# Patient Record
Sex: Female | Born: 1990
Health system: Southern US, Community
[De-identification: ages and names within clinical notes are randomized; demographics above are authoritative.]

## PROBLEM LIST (undated history)

## (undated) DIAGNOSIS — I10 Essential (primary) hypertension: Secondary | ICD-10-CM

## (undated) DIAGNOSIS — F32A Depression, unspecified: Secondary | ICD-10-CM

## (undated) DIAGNOSIS — J45909 Unspecified asthma, uncomplicated: Secondary | ICD-10-CM

## (undated) HISTORY — DX: Essential (primary) hypertension: I10

## (undated) HISTORY — DX: Unspecified asthma, uncomplicated: J45.909

## (undated) HISTORY — DX: Depression, unspecified: F32.A

## (undated) HISTORY — PX: EYE SURGERY: SHX253

---

## 2013-08-27 DIAGNOSIS — O1490 Unspecified pre-eclampsia, unspecified trimester: Secondary | ICD-10-CM

## 2020-05-30 LAB — OB RESULTS CONSOLE HGB/HCT, BLOOD
HCT: 39 (ref 29–41)
Hemoglobin: 12.9

## 2020-05-30 LAB — OB RESULTS CONSOLE ABO/RH: RH Type: NEGATIVE

## 2020-05-30 LAB — OB RESULTS CONSOLE HIV ANTIBODY (ROUTINE TESTING): HIV: NONREACTIVE

## 2020-05-30 LAB — OB RESULTS CONSOLE GC/CHLAMYDIA
Chlamydia: NEGATIVE
Gonorrhea: NEGATIVE

## 2020-05-30 LAB — OB RESULTS CONSOLE HEPATITIS B SURFACE ANTIGEN: Hepatitis B Surface Ag: NEGATIVE

## 2020-05-30 LAB — OB RESULTS CONSOLE RUBELLA ANTIBODY, IGM: Rubella: IMMUNE

## 2020-05-30 LAB — HEPATITIS C ANTIBODY: HCV Ab: NEGATIVE

## 2020-05-30 LAB — OB RESULTS CONSOLE RPR: RPR: NONREACTIVE

## 2020-05-30 LAB — OB RESULTS CONSOLE PLATELET COUNT: Platelets: 253

## 2020-05-30 LAB — OB RESULTS CONSOLE ANTIBODY SCREEN: Antibody Screen: NEGATIVE

## 2020-09-26 ENCOUNTER — Ambulatory Visit: Payer: Self-pay | Admitting: Family Medicine

## 2020-10-01 ENCOUNTER — Ambulatory Visit (INDEPENDENT_AMBULATORY_CARE_PROVIDER_SITE_OTHER): Payer: Medicaid Other | Admitting: Internal Medicine

## 2020-10-01 ENCOUNTER — Encounter: Payer: Self-pay | Admitting: Internal Medicine

## 2020-10-01 ENCOUNTER — Ambulatory Visit: Payer: Self-pay | Admitting: Internal Medicine

## 2020-10-01 ENCOUNTER — Other Ambulatory Visit: Payer: Self-pay

## 2020-10-01 DIAGNOSIS — F339 Major depressive disorder, recurrent, unspecified: Secondary | ICD-10-CM | POA: Diagnosis not present

## 2020-10-01 DIAGNOSIS — J4520 Mild intermittent asthma, uncomplicated: Secondary | ICD-10-CM

## 2020-10-01 MED ORDER — ESCITALOPRAM OXALATE 10 MG PO TABS: 10.0000 mg | ORAL_TABLET | Freq: Every day | ORAL | 1 refills | Status: DC

## 2020-10-01 NOTE — Progress Notes (Signed)
HPI  Pt presents to the clinic today to establish care and for management of the conditions listed below. She has not had a PCP in many years but has been following with OB/GYN- currently pregnant.  Childhood Asthma: She reports she outgrew this. She does get some chest tightness when she gets a cold. She does not have an Albuterol inhaler on hand.   Depression: Managed on Escitalopram and Buspirone. She has had some passive SI in the past but none currently. She is not seeing a therapist.  Flu: never Tetanus: 2017 Covid: never Pap Smear: 04/2020 Dentist: annually  No past medical history on file.  Current Outpatient Medications  Medication Sig Dispense Refill  . aspirin 81 MG chewable tablet Chew by mouth.    . busPIRone (BUSPAR) 15 MG tablet Take by mouth.    . Doxylamine-Pyridoxine 10-10 MG TBEC Take 1 tablet at bedtime daily.  If needed may increase to 2 tablets at bedtime, 1 tablet in the morning and 1 tablet in the afternoon.  Maximum of 4 tablets daily.    Marland Kitchen escitalopram (LEXAPRO) 10 MG tablet Take by mouth.     No current facility-administered medications for this visit.    No Known Allergies  No family history on file.  Social History   Socioeconomic History  . Marital status: Legally Separated    Spouse name: Not on file  . Number of children: Not on file  . Years of education: Not on file  . Highest education level: Not on file  Occupational History  . Not on file  Tobacco Use  . Smoking status: Not on file  . Smokeless tobacco: Not on file  Substance and Sexual Activity  . Alcohol use: Not on file  . Drug use: Not on file  . Sexual activity: Not on file  Other Topics Concern  . Not on file  Social History Narrative  . Not on file   Social Determinants of Health   Financial Resource Strain: Not on file  Food Insecurity: Not on file  Transportation Needs: Not on file  Physical Activity: Not on file  Stress: Not on file  Social Connections: Not on  file  Intimate Partner Violence: Not on file    ROS:  Constitutional: Denies fever, malaise, fatigue, headache or abrupt weight changes.  HEENT: Denies eye pain, eye redness, ear pain, ringing in the ears, wax buildup, runny nose, nasal congestion, bloody nose, or sore throat. Respiratory: Denies difficulty breathing, shortness of breath, cough or sputum production.   Cardiovascular: Denies chest pain, chest tightness, palpitations or swelling in the hands or feet.  Gastrointestinal: Denies abdominal pain, bloating, constipation, diarrhea or blood in the stool.  GU: Denies frequency, urgency, pain with urination, blood in urine, odor or discharge. Musculoskeletal: Denies decrease in range of motion, difficulty with gait, muscle pain or joint pain and swelling.  Skin: Denies redness, rashes, lesions or ulcercations.  Neurological: Denies dizziness, difficulty with memory, difficulty with speech or problems with balance and coordination.  Psych: Pt has a history of anxiety and depression. Denies SI/HI.  No other specific complaints in a complete review of systems (except as listed in HPI above).  PE:  BP 124/82 (BP Location: Right Arm, Patient Position: Sitting, Cuff Size: Large)   Pulse (!) 108   Temp (!) 97 F (36.1 C) (Temporal)   Ht 5\' 8"  (1.727 m)   Wt 198 lb 12.8 oz (90.2 kg)   SpO2 97%   BMI 30.23 kg/m  Wt  Readings from Last 3 Encounters:  10/01/20 198 lb 12.8 oz (90.2 kg)    General: Appears her stated age, pregnant, in NAD. HEENT: Head: normal shape and size; Neck: Neck supple, trachea midline. No masses, lumps or thyromegaly present.  Cardiovascular: Tachycardic with normal rhythm. S1,S2 noted.  No murmur, rubs or gallops noted. No JVD or BLE edema. Pulmonary/Chest: Normal effort and positive vesicular breath sounds. No respiratory distress. No wheezes, rales or ronchi noted.  Musculoskeletal: No difficulty with gait.  Neurological: Alert and oriented.  Psychiatric:  Mood and affect normal. Behavior is normal. Judgment and thought content normal.     Assessment and Plan:

## 2020-10-02 ENCOUNTER — Encounter: Payer: Self-pay | Admitting: Internal Medicine

## 2020-10-02 ENCOUNTER — Other Ambulatory Visit: Payer: Self-pay | Admitting: Internal Medicine

## 2020-10-02 DIAGNOSIS — F339 Major depressive disorder, recurrent, unspecified: Secondary | ICD-10-CM

## 2020-10-02 DIAGNOSIS — J45909 Unspecified asthma, uncomplicated: Secondary | ICD-10-CM | POA: Insufficient documentation

## 2020-10-02 HISTORY — DX: Major depressive disorder, recurrent, unspecified: F33.9

## 2020-10-02 HISTORY — DX: Unspecified asthma, uncomplicated: J45.909

## 2020-10-02 MED ORDER — ALBUTEROL SULFATE HFA 108 (90 BASE) MCG/ACT IN AERS: 2.0000 | INHALATION_SPRAY | Freq: Four times a day (QID) | RESPIRATORY_TRACT | 0 refills | Status: DC | PRN

## 2020-10-02 NOTE — Patient Instructions (Signed)
Persistent Depressive Disorder  Persistent depressive disorder (PDD) is a mental health condition. PDD causes symptoms of low-level depression for 2 years or longer. It may also be called long-term (chronic) depression or dysthymia. PDD may include episodes of more severe depression that last for about 2 weeks (major depressive disorder or MDD). PDD can affect the way you think, feel, and sleep. This condition may also affect your relationships. You may be more likely to get sick if you have PDD. Symptoms of PDD occur for most of the day and may include:  Feeling tired (fatigue).  Low energy.  Eating too much or too little.  Sleeping too much or too little.  Feeling restless or agitated.  Feeling hopeless.  Feeling worthless or guilty.  Feeling worried or nervous (anxiety).  Trouble concentrating or making decisions.  Low self-esteem.  A negative way of looking at things (outlook).  Not being able to have fun or feel pleasure.  Avoiding interacting with people.  Getting angry or annoyed easily (irritability).  Acting aggressive or angry. Follow these instructions at home: Activity  Go back to your normal activities as told by your doctor.  Exercise regularly as told by your doctor. General instructions  Take over-the-counter and prescription medicines only as told by your doctor.  Do not drink alcohol. Or, limit how much alcohol you drink to no more than 1 drink a day for nonpregnant women and 2 drinks a day for men. One drink equals 12 oz of beer, 5 oz of wine, or 1 oz of hard liquor. Alcohol can affect any antidepressant medicines you are taking. Talk with your doctor about your alcohol use.  Eat a healthy diet and get plenty of sleep.  Find activities that you enjoy each day.  Consider joining a support group. Your doctor may be able to suggest a support group.  Keep all follow-up visits as told by your doctor. This is important. Where to find more  information National Alliance on Mental Illness  www.nami.org [place] National Institute of Mental Health  www.nimh.nih.gov National Suicide Prevention Lifeline  (1-800-273-8255).  This is free, 24-hour help. Contact a doctor if:  Your symptoms get worse.  You have new symptoms.  You have trouble sleeping or doing your daily activities. Get help right away if:  You self-harm.  You have serious thoughts about hurting yourself or others.  You see, hear, taste, smell, or feel things that are not there (hallucinate). This information is not intended to replace advice given to you by your health care provider. Make sure you discuss any questions you have with your health care provider. Document Revised: 09/04/2017 Document Reviewed: 05/16/2016 Elsevier Patient Education  2020 Elsevier Inc.  

## 2020-10-02 NOTE — Assessment & Plan Note (Signed)
RX for Albuterol inhaler sent to pharmacy

## 2020-10-02 NOTE — Assessment & Plan Note (Signed)
Stable on Escitalopram (refilled today) and Buspirone Support offered

## 2020-10-03 ENCOUNTER — Encounter: Payer: Self-pay | Admitting: Family Medicine

## 2020-10-03 ENCOUNTER — Other Ambulatory Visit: Payer: Self-pay

## 2020-10-03 ENCOUNTER — Ambulatory Visit (INDEPENDENT_AMBULATORY_CARE_PROVIDER_SITE_OTHER): Payer: Medicaid Other | Admitting: Family Medicine

## 2020-10-03 VITALS — BP 132/90 | HR 111 | Wt 197.4 lb

## 2020-10-03 DIAGNOSIS — O26899 Other specified pregnancy related conditions, unspecified trimester: Secondary | ICD-10-CM | POA: Insufficient documentation

## 2020-10-03 DIAGNOSIS — Z3480 Encounter for supervision of other normal pregnancy, unspecified trimester: Secondary | ICD-10-CM

## 2020-10-03 DIAGNOSIS — F339 Major depressive disorder, recurrent, unspecified: Secondary | ICD-10-CM

## 2020-10-03 DIAGNOSIS — O2600 Excessive weight gain in pregnancy, unspecified trimester: Secondary | ICD-10-CM

## 2020-10-03 DIAGNOSIS — Z6791 Unspecified blood type, Rh negative: Secondary | ICD-10-CM

## 2020-10-03 DIAGNOSIS — O99210 Obesity complicating pregnancy, unspecified trimester: Secondary | ICD-10-CM | POA: Insufficient documentation

## 2020-10-03 NOTE — Patient Instructions (Signed)
[email or web address]-[email or web address]

## 2020-10-03 NOTE — Progress Notes (Signed)
lkbn

## 2020-10-03 NOTE — Progress Notes (Signed)
   PRENATAL VISIT NOTE  Subjective:  Laura Vaughan is a 29 y.o. [id number] at [redacted]w[redacted]d being seen today for ongoing prenatal care.  She is currently monitored for the following issues for this low-risk pregnancy and has Childhood asthma; Depression, recurrent (HCC); Obesity affecting pregnancy, antepartum; and Supervision of other normal pregnancy, antepartum on their problem list.  Patient reports no complaints.  Contractions: Not present. Vag. Bleeding: None.  Movement: Present. Denies leaking of fluid.   The following portions of the patient's history were reviewed and updated as appropriate: allergies, current medications, past family history, past medical history, past social history, past surgical history and problem list.   Objective:   Vitals:   10/03/20 0945  BP: 132/90  Pulse: (!) 111  Weight: 197 lb 6.4 oz (89.5 kg)    Fetal Status: Fetal Heart Rate (bpm): 150   Movement: Present     General:  Alert, oriented and cooperative. Patient is in no acute distress.  Skin: Skin is warm and dry. No rash noted.   Cardiovascular: Normal heart rate noted  Respiratory: Normal respiratory effort, no problems with respiration noted  Abdomen: Soft, gravid, appropriate for gestational age.  Pain/Pressure: Present     Pelvic: Cervical exam deferred        Extremities: Normal range of motion.     Mental Status: Normal mood and affect. Normal behavior. Normal judgment and thought content.   Assessment and Plan:  Pregnancy: [id number] at [redacted]w[redacted]d  1. Supervision of other normal pregnancy, antepartum Has 1 hr GTT at 24 week-- recommended repeat at 28-30 weeks. Discussed being fasting for next appointment Reviewed prenatal care at [facility] and is up to date Ordered MFM [place]  Discussed team based care at [facility] with CNM, FM OB, OB/GYN  Reviewed learners on inpatient team with student CNM and FM residents Reviewed [facility] as delivery location for the practice  2. Excessive weight gain affecting pregnancy TWG=20  lb 6.4 oz (9.253 kg) -- started pregnancy at BMI 26 and now 30 BMI.  Discussed focus on protein and low carb  3. Rh Neg (weak D) - plan for rhogam next visit  4. History of preeclampsia - taking ASA - Given BP cuff today  5. Depression - on medications but would benefit from counseling. Referral to [facility] placed today  Preterm labor symptoms and general obstetric precautions including but not limited to vaginal bleeding, contractions, leaking of fluid and fetal movement were reviewed in detail with the patient. Please refer to After Visit Summary for other counseling recommendations.   Return in about 2 weeks (around 10/17/2020) for Routine prenatal care, MD or APP, in person.  Future Appointments  Date Time Provider Department Center  10/02/2021  2:30 PM Lorre Munroe, NP [facility]    Federico Flake, MD

## 2020-10-06 NOTE — L&D Delivery Note (Signed)
OB/GYN Faculty Practice Delivery Note  Laura Vaughan is a 30 y.o. [id number] s/p vaginal delivery at [redacted]w[redacted]d. She was admitted for IOL for cHTN .   ROM: 10h 74m with clear fluid GBS Status:  Positive/-- (03/09 1500) Maximum Maternal Temperature:  Temp (24hrs), Avg:97.9 F (36.6 C), Min:97.7 F (36.5 C), Max:98.1 F (36.7 C)    Labor Progress: . Patient arrived at 3 cm dilation and was induced with AROM.   Delivery Date/Time: 01/02/2021 at 1312 Delivery: Called to room and patient was complete and pushing. Head delivered in ROA position. No nuchal cord present. Shoulder and body delivered in usual fashion. Infant with spontaneous cry, placed on mother's abdomen, dried and stimulated. Cord clamped x 2 after 1-minute delay, and cut by patient. Cord blood drawn. Placenta delivered spontaneously with gentle cord traction. Fundus firm with massage and Pitocin. Labia, perineum, vagina, and cervix inspected with hemostatic periclitoral laceration.   Placenta: spontaneous, complete, 3 vessel cord, very thin and small, sending to pathology  Complications: none  Lacerations: hemostatic periclitoral  EBL: 100 Analgesia: epidural    Infant: APGAR (1 MIN): 9  APGAR (5 MINS): 9  APGAR (10 MINS):    Weight: Pending   Derrel Nip, MD  PGY-2, [facility]  01/02/2021 1:37 PM

## 2020-10-08 ENCOUNTER — Encounter: Payer: Medicaid Other | Admitting: Licensed Clinical Social Worker

## 2020-10-08 ENCOUNTER — Telehealth: Payer: Self-pay | Admitting: Licensed Clinical Social Worker

## 2020-10-08 NOTE — Telephone Encounter (Signed)
Called Laura Vaughan twice left detailed message regarding mychart visit.

## 2020-10-10 ENCOUNTER — Ambulatory Visit: Payer: Medicaid Other | Attending: Family Medicine

## 2020-10-10 ENCOUNTER — Other Ambulatory Visit: Payer: Self-pay | Admitting: Family Medicine

## 2020-10-10 ENCOUNTER — Other Ambulatory Visit: Payer: Self-pay | Admitting: *Deleted

## 2020-10-10 ENCOUNTER — Other Ambulatory Visit: Payer: Self-pay

## 2020-10-10 DIAGNOSIS — O2622 Pregnancy care for patient with recurrent pregnancy loss, second trimester: Secondary | ICD-10-CM

## 2020-10-10 DIAGNOSIS — Z3A27 27 weeks gestation of pregnancy: Secondary | ICD-10-CM | POA: Insufficient documentation

## 2020-10-10 DIAGNOSIS — Z363 Encounter for antenatal screening for malformations: Secondary | ICD-10-CM | POA: Insufficient documentation

## 2020-10-10 DIAGNOSIS — O365921 Maternal care for other known or suspected poor fetal growth, second trimester, fetus 1: Secondary | ICD-10-CM | POA: Diagnosis not present

## 2020-10-10 DIAGNOSIS — Z3480 Encounter for supervision of other normal pregnancy, unspecified trimester: Secondary | ICD-10-CM

## 2020-10-10 DIAGNOSIS — O2620 Pregnancy care for patient with recurrent pregnancy loss, unspecified trimester: Secondary | ICD-10-CM | POA: Diagnosis not present

## 2020-10-10 DIAGNOSIS — O365920 Maternal care for other known or suspected poor fetal growth, second trimester, not applicable or unspecified: Secondary | ICD-10-CM | POA: Insufficient documentation

## 2020-10-10 DIAGNOSIS — O365931 Maternal care for other known or suspected poor fetal growth, third trimester, fetus 1: Secondary | ICD-10-CM

## 2020-10-13 ENCOUNTER — Encounter (HOSPITAL_COMMUNITY): Payer: Self-pay | Admitting: Family Medicine

## 2020-10-13 ENCOUNTER — Inpatient Hospital Stay (HOSPITAL_COMMUNITY)
Admission: EM | Admit: 2020-10-13 | Discharge: 2020-10-13 | Disposition: A | Discharge status: Home / Self Care | Payer: Medicaid Other | Attending: Family Medicine | Admitting: Family Medicine

## 2020-10-13 ENCOUNTER — Inpatient Hospital Stay (HOSPITAL_COMMUNITY): Payer: Medicaid Other

## 2020-10-13 ENCOUNTER — Other Ambulatory Visit: Payer: Self-pay

## 2020-10-13 DIAGNOSIS — O98519 Other viral diseases complicating pregnancy, unspecified trimester: Secondary | ICD-10-CM

## 2020-10-13 DIAGNOSIS — O99891 Other specified diseases and conditions complicating pregnancy: Secondary | ICD-10-CM | POA: Diagnosis not present

## 2020-10-13 DIAGNOSIS — O98512 Other viral diseases complicating pregnancy, second trimester: Secondary | ICD-10-CM | POA: Insufficient documentation

## 2020-10-13 DIAGNOSIS — O132 Gestational [pregnancy-induced] hypertension without significant proteinuria, second trimester: Secondary | ICD-10-CM | POA: Diagnosis not present

## 2020-10-13 DIAGNOSIS — J45909 Unspecified asthma, uncomplicated: Secondary | ICD-10-CM | POA: Diagnosis not present

## 2020-10-13 DIAGNOSIS — M549 Dorsalgia, unspecified: Secondary | ICD-10-CM | POA: Diagnosis not present

## 2020-10-13 DIAGNOSIS — O99512 Diseases of the respiratory system complicating pregnancy, second trimester: Secondary | ICD-10-CM | POA: Insufficient documentation

## 2020-10-13 DIAGNOSIS — U071 COVID-19: Secondary | ICD-10-CM | POA: Diagnosis not present

## 2020-10-13 DIAGNOSIS — Z3A27 27 weeks gestation of pregnancy: Secondary | ICD-10-CM | POA: Diagnosis not present

## 2020-10-13 LAB — COMPREHENSIVE METABOLIC PANEL
ALT: 22 U/L (ref 0–44)
AST: 25 U/L (ref 15–41)
Albumin: 3 g/dL — ABNORMAL LOW (ref 3.5–5.0)
Alkaline Phosphatase: 73 U/L (ref 38–126)
Anion gap: 11 (ref 5–15)
BUN: <5 mg/dL — ABNORMAL LOW (ref 6–20)
CO2: 21 mmol/L — ABNORMAL LOW (ref 22–32)
Calcium: 8.7 mg/dL — ABNORMAL LOW (ref 8.9–10.3)
Chloride: 102 mmol/L (ref 98–111)
Creatinine, Ser: 0.46 mg/dL (ref 0.44–1.00)
GFR, Estimated: >60 mL/min (ref >60)
Glucose, Bld: 74 mg/dL (ref 70–99)
Potassium: 3.7 mmol/L (ref 3.5–5.1)
Sodium: 134 mmol/L — ABNORMAL LOW (ref 135–145)
Total Bilirubin: 0.6 mg/dL (ref 0.3–1.2)
Total Protein: 6.3 g/dL — ABNORMAL LOW (ref 6.5–8.1)

## 2020-10-13 LAB — CBC WITH DIFFERENTIAL/PLATELET
Abs Immature Granulocytes: 0.05 10*3/uL (ref 0.00–0.07)
Basophils Absolute: 0 10*3/uL (ref 0.0–0.1)
Basophils Relative: 0 %
Eosinophils Absolute: 0 10*3/uL (ref 0.0–0.5)
Eosinophils Relative: 0 %
HCT: 33.3 % — ABNORMAL LOW (ref 36.0–46.0)
Hemoglobin: 11.2 g/dL — ABNORMAL LOW (ref 12.0–15.0)
Immature Granulocytes: 1 %
Lymphocytes Relative: 8 %
Lymphs Abs: 0.3 10*3/uL — ABNORMAL LOW (ref 0.7–4.0)
MCH: 32.2 pg (ref 26.0–34.0)
MCHC: 33.6 g/dL (ref 30.0–36.0)
MCV: 95.7 fL (ref 80.0–100.0)
Monocytes Absolute: 0.5 10*3/uL (ref 0.1–1.0)
Monocytes Relative: 12 %
Neutro Abs: 3.3 10*3/uL (ref 1.7–7.7)
Neutrophils Relative %: 79 %
Platelets: 206 10*3/uL (ref 150–400)
RBC: 3.48 MIL/uL — ABNORMAL LOW (ref 3.87–5.11)
RDW: 13.2 % (ref 11.5–15.5)
WBC: 4.2 10*3/uL (ref 4.0–10.5)
nRBC: 0 % (ref 0.0–0.2)

## 2020-10-13 LAB — URINALYSIS, ROUTINE W REFLEX MICROSCOPIC
Bacteria, UA: NONE SEEN
Bilirubin Urine: NEGATIVE
Glucose, UA: NEGATIVE mg/dL
Hgb urine dipstick: NEGATIVE
Ketones, ur: NEGATIVE mg/dL
Leukocytes,Ua: NEGATIVE
Nitrite: NEGATIVE
Protein, ur: 30 mg/dL — AB
Specific Gravity, Urine: 1.024 (ref 1.005–1.030)
pH: 6 (ref 5.0–8.0)

## 2020-10-13 LAB — TROPONIN I (HIGH SENSITIVITY): Troponin I (High Sensitivity): 3 ng/L (ref <18)

## 2020-10-13 LAB — SARS CORONAVIRUS 2 BY RT PCR (HOSPITAL ORDER, PERFORMED IN ~~LOC~~ HOSPITAL LAB): SARS Coronavirus 2: POSITIVE — AB

## 2020-10-13 LAB — BRAIN NATRIURETIC PEPTIDE: B Natriuretic Peptide: 21.8 pg/mL (ref 0.0–100.0)

## 2020-10-13 MED ORDER — CYCLOBENZAPRINE HCL 5 MG PO TABS
10.0000 mg | ORAL_TABLET | Freq: Once | ORAL | Status: AC
  Administered 2020-10-13: 10 mg via ORAL
  Filled 2020-10-13: qty 2

## 2020-10-13 MED ORDER — FLUTICASONE PROPIONATE 50 MCG/ACT NA SUSP: 2.0000 | Freq: Every day | NASAL | 0 refills | Status: DC

## 2020-10-13 MED ORDER — LACTATED RINGERS IV BOLUS
500.0000 mL | Freq: Once | INTRAVENOUS | Status: AC
  Administered 2020-10-13: 500 mL via INTRAVENOUS

## 2020-10-13 NOTE — ED Notes (Signed)
Report called to Nicole in MAU 

## 2020-10-13 NOTE — ED Notes (Signed)
PA notified of pt at triage needing MSE for MAU

## 2020-10-13 NOTE — Discharge Instructions (Signed)
Safe Medications in Pregnancy   Acne: Benzoyl Peroxide Salicylic Acid  Backache/Headache: Tylenol: 2 regular strength every 4 hours OR              2 Extra strength every 6 hours  Colds/Coughs/Allergies: Benadryl (alcohol free) 25 mg every 6 hours as needed Breath right strips Claritin Cepacol throat lozenges Chloraseptic throat spray Cold-Eeze- up to three times per day Cough drops, alcohol free Flonase (by prescription only) Guaifenesin Mucinex Robitussin DM (plain only, alcohol free) Saline nasal spray/drops Sudafed (pseudoephedrine) & Actifed ** use only after [redacted] weeks gestation and if you do not have high blood pressure Tylenol Vicks Vaporub Zinc lozenges Zyrtec   Constipation: Colace Ducolax suppositories Fleet enema Glycerin suppositories Metamucil Milk of magnesia Miralax Senokot Smooth move tea  Diarrhea: Kaopectate Imodium A-D  *NO pepto Bismol  Hemorrhoids: Anusol Anusol HC Preparation H Tucks  Indigestion: Tums Maalox Mylanta Zantac  Pepcid  Insomnia: Benadryl (alcohol free) 25mg every 6 hours as needed Tylenol PM Unisom, no Gelcaps  Leg Cramps: Tums MagGel  Nausea/Vomiting:  Bonine Dramamine Emetrol Ginger extract Sea bands Meclizine  Nausea medication to take during pregnancy:  Unisom (doxylamine succinate 25 mg tablets) Take one tablet daily at bedtime. If symptoms are not adequately controlled, the dose can be increased to a maximum recommended dose of two tablets daily (1/2 tablet in the morning, 1/2 tablet mid-afternoon and one at bedtime). Vitamin B6 100mg tablets. Take one tablet twice a day (up to 200 mg per day).  Skin Rashes: Aveeno products Benadryl cream or 25mg every 6 hours as needed Calamine Lotion 1% cortisone cream  Yeast infection: Gyne-lotrimin 7 Monistat 7   **If taking multiple medications, please check labels to avoid duplicating the same active ingredients **take medication as directed on  the label ** Do not exceed 4000 mg of tylenol in 24 hours **Do not take medications that contain aspirin or ibuprofen     10 Things You Can Do to Manage Your COVID-19 Symptoms at Home If you have possible or confirmed COVID-19: 1. Stay home from work and school. And stay away from other public places. If you must go out, avoid using any kind of public transportation, ridesharing, or taxis. 2. Monitor your symptoms carefully. If your symptoms get worse, call your healthcare provider immediately. 3. Get rest and stay hydrated. 4. If you have a medical appointment, call the healthcare provider ahead of time and tell them that you have or may have COVID-19. 5. For medical emergencies, call 911 and notify the dispatch personnel that you have or may have COVID-19. 6. Cover your cough and sneezes with a tissue or use the inside of your elbow. 7. Wash your hands often with soap and water for at least 20 seconds or clean your hands with an alcohol-based hand sanitizer that contains at least 60% alcohol. 8. As much as possible, stay in a specific room and away from other people in your home. Also, you should use a separate bathroom, if available. If you need to be around other people in or outside of the home, wear a mask. 9. Avoid sharing personal items with other people in your household, like dishes, towels, and bedding. 10. Clean all surfaces that are touched often, like counters, tabletops, and doorknobs. Use household cleaning sprays or wipes according to the label instructions. cdc.gov/coronavirus 04/06/2019 This information is not intended to replace advice given to you by your health care provider. Make sure you discuss any questions you have with your   health care provider. Document Revised: 09/08/2019 Document Reviewed: 09/08/2019 Elsevier Patient Education  [place].

## 2020-10-13 NOTE — MAU Note (Signed)
Laura Vaughan is a 30 y.o. at [redacted]w[redacted]d here in MAU reporting: back and leg pain since last night. Denies bleeding or LOF. Pt is also congested but denies any other covid symptoms or sick contacts.   Onset of complaint: yesterday  Pain score: 7/10 back pain, 6/10 leg pain Vitals:   10/13/20 1211 10/13/20 1248  BP: 127/85 129/79  Pulse: (!) 136 (!) 121  Resp: 20 16  Temp: 98.3 F (36.8 C) 99.7 F (37.6 C)  SpO2: 99% 99%     FHT: + FM, EFM applied  Lab orders placed from triage: UA, covid test

## 2020-10-13 NOTE — ED Triage Notes (Signed)
Emergency Medicine Provider OB Triage Evaluation Note  Laura Vaughan is a 30 y.o. female, [id number], at [redacted]w[redacted]d gestation who presents to the emergency department with complaints of low back pain onset last night. Prenatal care with [facility] in [place]. Reports normal fetal movement. Hx HTN, taking 81mg  ASA.  Review of  Systems  Positive: low back, lower abdominal pain Negative: vaginal bleeding, leaking fluid  Physical Exam  LMP 04/03/2020 (Exact Date)  General: Awake, no distress  HEENT: Atraumatic  Resp: Normal effort  Cardiac: Normal rate Abd: Nondistended, nontender  MSK: Moves all extremities without difficulty Neuro: Speech clear  Medical Decision Making  Pt evaluated for pregnancy concern and is stable for transfer to MAU. Pt is in agreement with plan for transfer.  12:08 PM Discussed with MAU Dr. 04/05/2020, who accepts patient in transfer.  Clinical Impression   1. Low back pain during pregnancy in third trimester       Adrian Blackwater, PA-C 10/13/20 1208

## 2020-10-13 NOTE — MAU Provider Note (Signed)
History     CSN: [id number]  Arrival date and time: 10/13/20 1139   Event Date/Time   First Provider Initiated Contact with Patient 10/13/20 1304      Chief Complaint  Patient presents with  . Back Pain  . Leg Pain   HPI Laura Vaughan is a 30 y.o. [id number] at [redacted]w[redacted]d who presents with back pain and leg pains. She reports the pain started last night and has continued today. She also reports a headache that started this morning. She denies any abdominal pain, vaginal bleeding or leaking of fluid. Reports normal fetal movement. She denies any HA, sore throat or loss of taste or smell. She reports she received a letter from her daughter's school that informed her that her daughter was in close contact with someone who had COVID and her daughter started experiencing symptoms today. She is not vaccinated for COVID.   OB History    Gravida  7   Para  2   Term  2   Preterm      AB  4   Living  2     SAB  4   IAB      Ectopic      Multiple      Live Births  2        Obstetric Comments  Had Preeclampsia in 2014-- not on magnesium per her recall        Past Medical History:  Diagnosis Date  . Asthma   . Depression   . Hypertension     Past Surgical History:  Procedure Laterality Date  . EYE SURGERY     cateracts    Family History  Problem Relation Age of Onset  . Depression Mother   . Miscarriages / [place] Mother   . Depression Father   . Hypertension Father   . Stroke Father   . Drug abuse Brother   . Asthma Brother   . Heart disease Maternal Grandmother   . Hypertension Maternal Grandmother   . Early death Maternal Grandfather   . Drug abuse Maternal Grandfather     Social History   Tobacco Use  . Smoking status: Never Smoker  . Smokeless tobacco: Never Used  Substance Use Topics  . Alcohol use: Not Currently  . Drug use: Never    Allergies: No Known Allergies  Medications Prior to Admission  Medication Sig Dispense Refill Last Dose   . albuterol (PROAIR HFA) 108 (90 Base) MCG/ACT inhaler Inhale 2 puffs into the lungs every 6 (six) hours as needed for wheezing or shortness of breath. 8 g 2   . aspirin 81 MG chewable tablet Chew by mouth.     . busPIRone (BUSPAR) 15 MG tablet Take by mouth.     . Doxylamine-Pyridoxine 10-10 MG TBEC Take 1 tablet at bedtime daily.  If needed may increase to 2 tablets at bedtime, 1 tablet in the morning and 1 tablet in the afternoon.  Maximum of 4 tablets daily.     Marland Kitchen escitalopram (LEXAPRO) 10 MG tablet Take 1 tablet (10 mg total) by mouth at bedtime. 90 tablet 1   . Prenatal Vit-Fe Fumarate-FA (PRENATAL MULTIVITAMIN) TABS tablet Take 1 tablet by mouth daily at 12 noon.       Review of Systems  Constitutional: Negative.  Negative for fatigue and fever.  HENT: Positive for congestion.   Respiratory: Negative.  Negative for shortness of breath.   Cardiovascular: Negative.  Negative for chest pain.  Gastrointestinal: Negative.  Negative for abdominal pain, constipation, diarrhea, nausea and vomiting.  Genitourinary: Negative.  Negative for dysuria, vaginal bleeding and vaginal discharge.  Musculoskeletal: Positive for back pain.       Leg pain  Neurological: Positive for headaches. Negative for dizziness.   Physical Exam   Blood pressure 129/79, pulse (!) 121, temperature 99.7 F (37.6 C), temperature source Oral, resp. rate 16, height 5\' 8"  (1.727 m), weight 90.4 kg, last menstrual period 04/03/2020, SpO2 99 %.  Physical Exam Vitals and nursing note reviewed.  Constitutional:      General: She is not in acute distress.    Appearance: She is well-developed and well-nourished.  HENT:     Head: Normocephalic.  Eyes:     Pupils: Pupils are equal, round, and reactive to light.  Cardiovascular:     Rate and Rhythm: Regular rhythm. Tachycardia present.     Heart sounds: Normal heart sounds.  Pulmonary:     Effort: Pulmonary effort is normal. No respiratory distress.     Breath sounds:  Normal breath sounds. No stridor. No wheezing, rhonchi or rales.  Abdominal:     General: Bowel sounds are normal. There is no distension.     Palpations: Abdomen is soft.     Tenderness: There is no abdominal tenderness.  Skin:    General: Skin is warm and dry.  Neurological:     Mental Status: She is alert and oriented to person, place, and time.  Psychiatric:        Mood and Affect: Mood and affect normal.        Behavior: Behavior normal.        Thought Content: Thought content normal.        Judgment: Judgment normal.    Fetal Tracing:  Baseline: 170 Variability: minimal to moderate Accels: none Decels: none  Toco: none  Cervix: closed/thick/posterior  MAU Course  Procedures Results for orders placed or performed during the hospital encounter of 10/13/20 (from the past 24 hour(s))  Urinalysis, Routine w reflex microscopic     Status: Abnormal   Collection Time: 10/13/20 12:53 PM  Result Value Ref Range   Color, Urine YELLOW YELLOW   APPearance HAZY (A) CLEAR   Specific Gravity, Urine 1.024 1.005 - 1.030   pH 6.0 5.0 - 8.0   Glucose, UA NEGATIVE NEGATIVE mg/dL   Hgb urine dipstick NEGATIVE NEGATIVE   Bilirubin Urine NEGATIVE NEGATIVE   Ketones, ur NEGATIVE NEGATIVE mg/dL   Protein, ur 30 (A) NEGATIVE mg/dL   Nitrite NEGATIVE NEGATIVE   Leukocytes,Ua NEGATIVE NEGATIVE   RBC / HPF 0-5 0 - 5 RBC/hpf   WBC, UA 0-5 0 - 5 WBC/hpf   Bacteria, UA NONE SEEN NONE SEEN   Squamous Epithelial / LPF 0-5 0 - 5   Mucus PRESENT   CBC with Differential/Platelet     Status: Abnormal   Collection Time: 10/13/20  1:43 PM  Result Value Ref Range   WBC 4.2 4.0 - 10.5 K/uL   RBC 3.48 (L) 3.87 - 5.11 MIL/uL   Hemoglobin 11.2 (L) 12.0 - 15.0 g/dL   HCT 12/11/20 (L) 65.6 - 81.2 %   MCV 95.7 80.0 - 100.0 fL   MCH 32.2 26.0 - 34.0 pg   MCHC 33.6 30.0 - 36.0 g/dL   RDW 75.1 70.0 - 17.4 %   Platelets 206 150 - 400 K/uL   nRBC 0.0 0.0 - 0.2 %   Neutrophils Relative % 79 %   Neutro Abs  3.3  1.7 - 7.7 K/uL   Lymphocytes Relative 8 %   Lymphs Abs 0.3 (L) 0.7 - 4.0 K/uL   Monocytes Relative 12 %   Monocytes Absolute 0.5 0.1 - 1.0 K/uL   Eosinophils Relative 0 %   Eosinophils Absolute 0.0 0.0 - 0.5 K/uL   Basophils Relative 0 %   Basophils Absolute 0.0 0.0 - 0.1 K/uL   Immature Granulocytes 1 %   Abs Immature Granulocytes 0.05 0.00 - 0.07 K/uL   DG Chest Portable 1 View  Result Date: 10/13/2020 CLINICAL DATA:  Shortness of breath, [redacted] weeks pregnant, low back pain EXAM: PORTABLE CHEST 1 VIEW COMPARISON:  None. FINDINGS: The heart size and mediastinal contours are within normal limits. Both lungs are clear. The visualized skeletal structures are unremarkable. IMPRESSION: No active disease. Electronically Signed   By: Judie Petit.  Shick M.D.   On: 10/13/2020 14:00    MDM UA COVID-19 test CBC with Diff CMP, BNP, Troponin Chest x-ray LR Bolus  After LR bolus, FHR resolved to 150 baseline with moderate variability.  Reviewed positive COVID results with patient. Discussed supportive care at home and quarantine recommendations. Message sent to [facility]  to reschedule appointment this week.   Assessment and Plan   1. COVID-19 affecting pregnancy, antepartum   2. [redacted] weeks gestation of pregnancy   3. Back pain affecting pregnancy, antepartum    -Discharge home in stable condition -Rx for flonase sent to patient's pharmacy -COVID precautions discussed -Patient advised to follow-up with OB after quarantine period or MAU if needed sooner -Patient may return to MAU as needed or if her condition were to change or worsen   Rolm Bookbinder CNM 10/13/2020, 1:04 PM

## 2020-10-16 ENCOUNTER — Encounter: Payer: Medicaid Other | Admitting: Licensed Clinical Social Worker

## 2020-10-16 ENCOUNTER — Other Ambulatory Visit: Payer: Self-pay

## 2020-10-17 ENCOUNTER — Telehealth (INDEPENDENT_AMBULATORY_CARE_PROVIDER_SITE_OTHER): Payer: Medicaid Other | Admitting: Family Medicine

## 2020-10-17 ENCOUNTER — Encounter: Payer: Self-pay | Admitting: Family Medicine

## 2020-10-17 ENCOUNTER — Other Ambulatory Visit: Payer: Self-pay

## 2020-10-17 VITALS — BP 144/92

## 2020-10-17 DIAGNOSIS — O99213 Obesity complicating pregnancy, third trimester: Secondary | ICD-10-CM

## 2020-10-17 DIAGNOSIS — O99343 Other mental disorders complicating pregnancy, third trimester: Secondary | ICD-10-CM

## 2020-10-17 DIAGNOSIS — O98513 Other viral diseases complicating pregnancy, third trimester: Secondary | ICD-10-CM

## 2020-10-17 DIAGNOSIS — O365930 Maternal care for other known or suspected poor fetal growth, third trimester, not applicable or unspecified: Secondary | ICD-10-CM

## 2020-10-17 DIAGNOSIS — O360130 Maternal care for anti-D [Rh] antibodies, third trimester, not applicable or unspecified: Secondary | ICD-10-CM

## 2020-10-17 DIAGNOSIS — Z6791 Unspecified blood type, Rh negative: Secondary | ICD-10-CM

## 2020-10-17 DIAGNOSIS — Z3480 Encounter for supervision of other normal pregnancy, unspecified trimester: Secondary | ICD-10-CM

## 2020-10-17 DIAGNOSIS — U071 COVID-19: Secondary | ICD-10-CM

## 2020-10-17 DIAGNOSIS — Z3A28 28 weeks gestation of pregnancy: Secondary | ICD-10-CM

## 2020-10-17 DIAGNOSIS — O365990 Maternal care for other known or suspected poor fetal growth, unspecified trimester, not applicable or unspecified: Secondary | ICD-10-CM | POA: Insufficient documentation

## 2020-10-17 DIAGNOSIS — E669 Obesity, unspecified: Secondary | ICD-10-CM

## 2020-10-17 DIAGNOSIS — O26899 Other specified pregnancy related conditions, unspecified trimester: Secondary | ICD-10-CM

## 2020-10-17 DIAGNOSIS — O99210 Obesity complicating pregnancy, unspecified trimester: Secondary | ICD-10-CM

## 2020-10-17 DIAGNOSIS — F339 Major depressive disorder, recurrent, unspecified: Secondary | ICD-10-CM

## 2020-10-17 HISTORY — DX: COVID-19: U07.1

## 2020-10-17 HISTORY — DX: Other viral diseases complicating pregnancy, third trimester: O98.513

## 2020-10-17 NOTE — Progress Notes (Signed)
I connected with Laura Vaughan 10/17/20 at  8:45 AM EST by: MyChart video and verified that I am speaking with the correct person using two identifiers.  Patient is located at home and provider is located at [facility].     The purpose of this virtual visit is to provide medical care while limiting exposure to the novel coronavirus. I discussed the limitations, risks, security and privacy concerns of performing an evaluation and management service by MyChart video and the availability of in person appointments. I also discussed with the patient that there may be a patient responsible charge related to this service. By engaging in this virtual visit, you consent to the provision of healthcare.  Additionally, you authorize for your insurance to be billed for the services provided during this visit.  The patient expressed understanding and agreed to proceed.  The following staff members participated in the virtual visit:  Scheryl Marten    PRENATAL VISIT NOTE  Subjective:  Laura Vaughan is a 30 y.o. [id number] at [redacted]w[redacted]d  for phone visit for ongoing prenatal care.  She is currently monitored for the following issues for this high-risk pregnancy and has Childhood asthma; Depression, recurrent (HCC); Obesity affecting pregnancy, antepartum; Supervision of other normal pregnancy, antepartum; Rh negative state in antepartum period; Pregnancy affected by fetal growth restriction; and COVID-19 affecting pregnancy in third trimester on their problem list.  Patient reports no complaints. See COVID in pregnancy as patient has URI sx. Contractions: Not present. Vag. Bleeding: None.  Movement: Present. Denies leaking of fluid.   The following portions of the patient's history were reviewed and updated as appropriate: allergies, current medications, past family history, past medical history, past social history, past surgical history and problem list.   Objective:   Vitals:   10/17/20 0854  BP: (!) 144/92    Self-Obtained  Fetal Status:     Movement: Present     Assessment and Plan:  Pregnancy: [id number] at [redacted]w[redacted]d  1. Supervision of other normal pregnancy, antepartum UP to date but need 3rd trimester labs (GTT, CBC, and rhogam)  2. Rh negative state in antepartum period Will need RHOGAM at next visit  3. Obesity affecting pregnancy, antepartum TWG= 22 lb 3.2 oz (10.1 kg)   4. Depression, recurrent (HCC) Has appt with [facility] On SSRI  5. COVID 19 Pregnancy - Sx are improving per patient (currently has fatigue, achy, cough).  - Reviewed when to seek care if worsening SOB  6. Borderline IUGR Seen on 1/5 [place], 10th%  Has repeat US scheduled Discussed with patient if IUGR confirmed then she will need regular growth [place] and possible delivery at 39 wk.    Preterm labor symptoms and general obstetric precautions including but not limited to vaginal bleeding, contractions, leaking of fluid and fetal movement were reviewed in detail with the patient.  Return in about 2 weeks (around 10/31/2020) for Routine prenatal care, 28 wk labs.  Future Appointments  Date Time Provider Department Center  10/22/2020 11:00 AM Gwyndolyn Saxon, LCSW [facility] None  10/25/2020  8:15 AM [facility] LAB [facility]  10/31/2020 11:15 AM Federico Flake, MD [facility]  10/31/2020  1:45 PM [facility] NURSE [facility]  10/31/2020  2:00 PM [facility] US1 [facility]  10/02/2021  2:30 PM Baity, Salvadore Oxford, NP [facility]     Time spent on virtual visit: 10 minutes  Federico Flake, MD

## 2020-10-17 NOTE — Progress Notes (Signed)
I connected with  Laura Vaughan on 10/17/20 at  8:45 AM EST by telephone and verified that I am speaking with the correct person using two identifiers.   I discussed the limitations, risks, security and privacy concerns of performing an evaluation and management service by telephone and the availability of in person appointments. I also discussed with the patient that there may be a patient responsible charge related to this service. The patient expressed understanding and agreed to proceed.  Scheryl Marten, RN 10/17/2020  8:55 AM

## 2020-10-22 ENCOUNTER — Encounter: Payer: Medicaid Other | Admitting: Licensed Clinical Social Worker

## 2020-10-23 ENCOUNTER — Ambulatory Visit (INDEPENDENT_AMBULATORY_CARE_PROVIDER_SITE_OTHER): Payer: Medicaid Other | Admitting: Licensed Clinical Social Worker

## 2020-10-23 DIAGNOSIS — F32A Depression, unspecified: Secondary | ICD-10-CM | POA: Diagnosis not present

## 2020-10-23 DIAGNOSIS — O99340 Other mental disorders complicating pregnancy, unspecified trimester: Secondary | ICD-10-CM | POA: Diagnosis not present

## 2020-10-23 DIAGNOSIS — Z3A00 Weeks of gestation of pregnancy not specified: Secondary | ICD-10-CM | POA: Diagnosis not present

## 2020-10-23 NOTE — BH Specialist Note (Signed)
Integrated Behavioral Health Initial In-Person Visit  MRN: [id number] Name: Laura Vaughan  Number of Integrated Behavioral Health Clinician visits:: 1/6 Session Start time: 3:22pm  Session End time: 3:49pm Total time:  27 minutes via phone (pt was unable to connect to mychart)  Types of Service: General Integrated Behavioral Health   Interpretor:no  Interpretor Name and Language: none   Warm Hand Off Completed.        Subjective: Laura Vaughan is a 30 y.o. female accompanied by n/a Patient was referred by Dr. 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 Sillas has secured connection in place. Reports can use additional support at home   Goals Addressed: Patient will: Reduce symptoms of: depression  Increase knowledge and/or ability of: healthy habits and coping skills  Demonstrate ability to: self manage symptoms   Progress towards Goals: Ongoing   Interventions: Interventions utilized: supportive counseling   Standardized Assessments completed:       Assessment: Patient currently experiencing depression affecting pregnancy   Patient may benefit from integrated behavioral health   Plan: Follow up with behavioral health clinician on : 3 weeks via mychart  Behavioral recommendations: Schedule self care plan, create smart goals regarding school to prevent burnout, prioritize rest and redirect negative self  thoughts Referral(s): none  "From scale of 1-10, how likely are you to follow plan?":   Gwyndolyn Saxon, LCSW

## 2020-10-25 ENCOUNTER — Other Ambulatory Visit: Payer: Self-pay

## 2020-10-25 ENCOUNTER — Other Ambulatory Visit (INDEPENDENT_AMBULATORY_CARE_PROVIDER_SITE_OTHER): Payer: Medicaid Other

## 2020-10-25 DIAGNOSIS — Z6791 Unspecified blood type, Rh negative: Secondary | ICD-10-CM

## 2020-10-25 DIAGNOSIS — O360930 Maternal care for other rhesus isoimmunization, third trimester, not applicable or unspecified: Secondary | ICD-10-CM

## 2020-10-25 DIAGNOSIS — Z3A28 28 weeks gestation of pregnancy: Secondary | ICD-10-CM

## 2020-10-25 DIAGNOSIS — Z3480 Encounter for supervision of other normal pregnancy, unspecified trimester: Secondary | ICD-10-CM

## 2020-10-25 MED ORDER — RHO D IMMUNE GLOBULIN 1500 UNIT/2ML IJ SOSY
300.0000 ug | PREFILLED_SYRINGE | Freq: Once | INTRAMUSCULAR | Status: AC
  Administered 2020-10-25: 300 ug via INTRAMUSCULAR

## 2020-10-25 NOTE — Progress Notes (Signed)
Patient seen and assessed by nursing staff.  Agree with documentation and plan.  

## 2020-10-25 NOTE — Progress Notes (Signed)
Pt here to 28 week labs and Rhogam. Pt tolerated injection well.

## 2020-10-26 LAB — GLUCOSE TOLERANCE, 2 HOURS W/ 1HR
Glucose, 1 hour: 156 mg/dL (ref 65–179)
Glucose, 2 hour: 122 mg/dL (ref 65–152)
Glucose, Fasting: 82 mg/dL (ref 65–91)

## 2020-10-26 LAB — CBC
Hematocrit: 33.1 % — ABNORMAL LOW (ref 34.0–46.6)
Hemoglobin: 11.5 g/dL (ref 11.1–15.9)
MCH: 32.6 pg (ref 26.6–33.0)
MCHC: 34.7 g/dL (ref 31.5–35.7)
MCV: 94 fL (ref 79–97)
Platelets: 230 10*3/uL (ref 150–450)
RBC: 3.53 x10E6/uL — ABNORMAL LOW (ref 3.77–5.28)
RDW: 12.5 % (ref 11.7–15.4)
WBC: 8.5 10*3/uL (ref 3.4–10.8)

## 2020-10-26 LAB — HIV ANTIBODY (ROUTINE TESTING W REFLEX): HIV Screen 4th Generation wRfx: NONREACTIVE

## 2020-10-26 LAB — ANTIBODY SCREEN: Antibody Screen: NEGATIVE

## 2020-10-26 LAB — RPR: RPR Ser Ql: NONREACTIVE

## 2020-10-31 ENCOUNTER — Encounter: Payer: Self-pay | Admitting: *Deleted

## 2020-10-31 ENCOUNTER — Ambulatory Visit: Payer: Medicaid Other | Admitting: *Deleted

## 2020-10-31 ENCOUNTER — Other Ambulatory Visit: Payer: Self-pay

## 2020-10-31 ENCOUNTER — Ambulatory Visit (INDEPENDENT_AMBULATORY_CARE_PROVIDER_SITE_OTHER): Payer: Medicaid Other | Admitting: Family Medicine

## 2020-10-31 ENCOUNTER — Ambulatory Visit: Payer: Medicaid Other | Attending: Obstetrics and Gynecology

## 2020-10-31 ENCOUNTER — Other Ambulatory Visit: Payer: Self-pay | Admitting: *Deleted

## 2020-10-31 VITALS — BP 123/82 | HR 108 | Wt 202.6 lb

## 2020-10-31 DIAGNOSIS — Z23 Encounter for immunization: Secondary | ICD-10-CM

## 2020-10-31 DIAGNOSIS — O2623 Pregnancy care for patient with recurrent pregnancy loss, third trimester: Secondary | ICD-10-CM

## 2020-10-31 DIAGNOSIS — O98513 Other viral diseases complicating pregnancy, third trimester: Secondary | ICD-10-CM | POA: Diagnosis not present

## 2020-10-31 DIAGNOSIS — O99210 Obesity complicating pregnancy, unspecified trimester: Secondary | ICD-10-CM

## 2020-10-31 DIAGNOSIS — Z362 Encounter for other antenatal screening follow-up: Secondary | ICD-10-CM

## 2020-10-31 DIAGNOSIS — O365990 Maternal care for other known or suspected poor fetal growth, unspecified trimester, not applicable or unspecified: Secondary | ICD-10-CM

## 2020-10-31 DIAGNOSIS — U071 COVID-19: Secondary | ICD-10-CM | POA: Insufficient documentation

## 2020-10-31 DIAGNOSIS — O365920 Maternal care for other known or suspected poor fetal growth, second trimester, not applicable or unspecified: Secondary | ICD-10-CM | POA: Diagnosis not present

## 2020-10-31 DIAGNOSIS — Z363 Encounter for antenatal screening for malformations: Secondary | ICD-10-CM

## 2020-10-31 DIAGNOSIS — Z3A30 30 weeks gestation of pregnancy: Secondary | ICD-10-CM

## 2020-10-31 DIAGNOSIS — Z6791 Unspecified blood type, Rh negative: Secondary | ICD-10-CM

## 2020-10-31 DIAGNOSIS — O365930 Maternal care for other known or suspected poor fetal growth, third trimester, not applicable or unspecified: Secondary | ICD-10-CM

## 2020-10-31 DIAGNOSIS — O10013 Pre-existing essential hypertension complicating pregnancy, third trimester: Secondary | ICD-10-CM

## 2020-10-31 DIAGNOSIS — O26899 Other specified pregnancy related conditions, unspecified trimester: Secondary | ICD-10-CM

## 2020-10-31 DIAGNOSIS — Z3480 Encounter for supervision of other normal pregnancy, unspecified trimester: Secondary | ICD-10-CM

## 2020-10-31 NOTE — Progress Notes (Signed)
   PRENATAL VISIT NOTE  Subjective:  Laura Vaughan is a 30 y.o. [id number] at [redacted]w[redacted]d being seen today for ongoing prenatal care.  She is currently monitored for the following issues for this low-risk pregnancy and has Childhood asthma; Depression, recurrent (HCC); Obesity affecting pregnancy, antepartum; Supervision of other normal pregnancy, antepartum; Rh negative state in antepartum period; Pregnancy affected by fetal growth restriction; and COVID-19 affecting pregnancy in third trimester on their problem list.  Patient reports sharp pain in lower abdomen-- report it was once episode. resolved.  Described at a sharp pain in vagina.  Contractions: Not present. Vag. Bleeding: None.  Movement: Present. Denies leaking of fluid.   The following portions of the patient's history were reviewed and updated as appropriate: allergies, current medications, past family history, past medical history, past social history, past surgical history and problem list.   Objective:   Vitals:   10/31/20 1121  BP: 123/82  Pulse: (!) 108  Weight: 202 lb 9.6 oz (91.9 kg)    Fetal Status: Fetal Heart Rate (bpm): 158   Movement: Present     General:  Alert, oriented and cooperative. Patient is in no acute distress.  Skin: Skin is warm and dry. No rash noted.   Cardiovascular: Normal heart rate noted  Respiratory: Normal respiratory effort, no problems with respiration noted  Abdomen: Soft, gravid, appropriate for gestational age.  Pain/Pressure: Present     Pelvic: Cervical exam deferred        Extremities: Normal range of motion.     Mental Status: Normal mood and affect. Normal behavior. Normal judgment and thought content.   Assessment and Plan:  Pregnancy: [id number] at [redacted]w[redacted]d 1. Supervision of other normal pregnancy, antepartum Up to date Tdap today Desires to sign BTS paperwork but is debating BTS vs IUD. Had IUD prior and "loved it."   BTS consent for Medicaid signed today with CMA Arloa Koh  Discussed  pediatric provider  2. Rh negative state in antepartum period Received rhogam  3. Pregnancy affected by fetal growth restriction EFW 10/10/20 was 10th% Repeat US today  4. Obesity affecting pregnancy, antepartum TWG=25 lb 9.6 oz (11.6 kg) which is above goal  Preterm labor symptoms and general obstetric precautions including but not limited to vaginal bleeding, contractions, leaking of fluid and fetal movement were reviewed in detail with the patient. Please refer to After Visit Summary for other counseling recommendations.   Return in about 2 weeks (around 11/14/2020) for Routine prenatal care, MD or APP.  Future Appointments  Date Time Provider Department Center  10/31/2020  1:45 PM [facility] NURSE [facility]  10/31/2020  2:00 PM [facility] US1 [facility]  11/14/2020  1:45 PM Federico Flake, MD [facility]  10/02/2021  2:30 PM Baity, Salvadore Oxford, NP [facility]    Federico Flake, MD

## 2020-10-31 NOTE — Patient Instructions (Signed)
AREA PEDIATRIC/FAMILY PRACTICE PHYSICIANS  [place]:  [facility] [address] [place], [place] [address] [phone] Valinda Hoar) Gildardo Pounds, M.D. Roda Shutters, M.D. Boone Master, P.A.  [facility] [address] [place], [place] [address] [phone] Valinda Hoar) Dixie Dials, M.D. Carlus Pavlov, M.D. Bronson Ing, M.D. M.P.H. Erick Colace, M.D. M.P.H. Glennon Hamilton, P.A., Manus Gunning, PNP [place] ([id number])   . [facility] Melodie Bouillon, MD; Lum Babe, MD; Sheffield Slider, MD; Leveda Anna, MD; McDiarmid, MD; Jerene Bears, MD; Jennette Kettle, MD; Gwendolyn Grant, MD o [address] [place], [place] [address] o [phone] o Mon-Fri 8:30-12:30, 1:30-5:00 o Providers come to see babies at [facility] o Accepting Medicaid . [facility] o Limited providers who accept newborns: Docia Chuck, MD; Kateri Plummer, MD; Paulino Rily, MD o [address], [place], [place] [address] o [phone] o Mon-Fri 8:00-5:30 o Babies seen by providers at [facility] o Does NOT accept Medicaid o Please call early in hospitalization for appointment (limited availability)  . [facility] Fatima Sanger, MD o [address], [place], [place] [address] o [phone] o Mon, Tue, Thur, Fri 8:30-5:00, Wed 10:00-7:00 (closed 1-2pm) o Babies seen by [facility] providers o Accepting Medicaid . Donnie Coffin - Pediatrician Fae Pippin, MD o [address], [place], [place] [address] o [phone] o Mon-Fri 8:30-5:00, Sat 8:30-12:00 o Provider comes to see babies at [facility] o Accepting Medicaid o Must have been referred from current patients or contacted office prior to delivery . [facility]) Leotis Pain, MD; Ave Filter, MD; Luna Fuse, MD; Kennedy Bucker, MD; Konrad Dolores, MD; Kathlene November, MD; Jenne Campus, MD;  Lubertha South, MD; Wynetta Emery, MD; Duffy Rhody, MD; Gerre Couch, NP; Shirl Harris, NP o [address] [place], [place], [place] [address] o [phone] o Mon, Tue, Thur, Fri 8:30-5:30, Wed 9:30-5:30, Sat 8:30-12:30 o Babies seen by [facility] providers o Accepting Medicaid o Only accepting infants of first-time parents or siblings of current patients [facility] discharge coordinator will make follow-up appointment . Cyril Mourning o [address], [place], [place]  [address] o [phone]   Fax - [phone] . [facility] o [address], [place], [place]  [address] o Phone - [phone]   Fax - [phone] . Lucio Edward o [address], [place], [place]  [address] o [phone]  [place] [phone]) . [facility] Jorge Mandril, MD; Alita Chyle, MD; Princella Ion, MD; MD; Earlene Plater, MD; Jamesetta Orleans, MD; Alvera Novel, MD; Clarene Duke, MD; Rana Snare, MD; Carmon Ginsberg, MD; Alinda Money, MD; Hosie Poisson, MD; Mayford Knife, MD o [address] [place], [place] [address] o [phone] o Mon-Fri 8:30-5:00 (extended evenings Mon-Thur as needed), Sat-Sun 10:00-1:00 o Providers come to see babies at [facility] o Accepting Medicaid for families of first-time babies and families with all children in the household age 97 and under. Must register with office prior to making appointment (M-F only). [facility] Odella Aquas, NP; Lynelle Doctor, MD; Susann Givens, MD; [place], [place] o [address] [place], [place] [address] o [phone] o Mon-Fri 8:00-5:00 o Babies seen by providers at [facility] o Does NOT accept Medicaid/Commercial Insurance Only . [facility] [place] ([facility])  Suzette Battiest, MD; Zachery Dauer, MD; Stefan Church, MD; Sabino Dick, MD; Quitman Livings, MD; Farris Has, MD; Gaynell Face, MD; Betha Loa, MD; Colon Flattery, MD; Clifton James, MD o [address] [place] [place], [place] [address] o [phone] o Mon-Fri 8:30-5:30, Sat (Oct.-Mar.)  9:00-1:00 o Babies seen by  providers at [facility] o Accepting [facility] ([id number]) . [facility] Dimitri, MD; Sheliah Hatch, MD o [address], [place], [place] [address] o [phone] o Mon-Fri 8:30-5:00, Sat 8:30-12:00 o Providers come to see babies at [facility] o Does NOT accept Medicaid . [facility] Cindy Hazy, [place]; Holmen, MD; [place], [place]; Wynelle Link, MD; Azucena Cecil, MD o [address], [place], [place] [address] o [phone] o Mon-Fri 8:00-5:00 o Babies seen by providers at [facility] o Does NOT accept Medicaid o Only accepting babies of parents who are patients o Please call early in hospitalization for appointment (limited availability) . [facility] Pediatricians Lamar Benes, MD; Abran Cantor, MD; Early Osmond, MD; Cherre Huger, NP; Hyacinth Meeker, MD; Dwan Bolt, MD; Jarold Motto, NP; Dario Guardian, MD; Talmage Nap, MD; Maisie Fus, MD; Pricilla Holm, MD; Tama High, MD o [address] [place], [place], [place] [address] o [phone] o Mon-Fri 8:00-5:00, Sat 9:00-12:00 o Providers come to see babies at [facility] o Does NOT accept [facility] [phone]) . [facility] o Limited providers accepting new patients: Drema Pry, NP; [place], PA o [address], [place], [place] [address] o [phone] o Mon-Fri 8:00-5:00 o Babies seen by providers at [facility] o Does NOT accept Medicaid o Only accepting babies of parents who are patients o Please call early in hospitalization for appointment (limited availability) . [facility] Luan Pulling, MD; Nash Dimmer, MD o [address] [place] [place], [place] [address] o [phone] (press 1 to schedule appointment) o Mon-Fri 8:00-5:00 o Providers come to see babies at [facility] o Does NOT accept Medicaid . [facility] Cristino Martes, MD o [address] [place], [place] [address] o [phone] o Mon-Fri 8:30-5:00 (lunch 12:30-1:00), extended hours by appointment only Wed  5:00-6:30 o Babies seen by [facility] providers o Accepting Medicaid . [facility] Abbot, MD; [place], MD; Hassan Rowan, MD o [address] [place], [place], [place] [address] o [phone] o Mon-Fri 8:00-5:00 o Babies seen by [facility] providers o Does NOT accept Medicaid . Nature conservation officer at [place] [address] Elsworth Soho, MD; Durene Cal, MD; [place], DO o [address] [place], [place] [address] o [phone] o Mon-Fri 8:00-5:00 o Babies seen by [facility] providers o Does NOT accept Medicaid . [facility] o [place], [place]; [place], [place]; [place], NP; Avis Epley, MD; Vonna Kotyk, MD; Clance Boll, MD; Stevphen Rochester, NP; Arvilla Market, NP; Ann Maki, NP; Otis Dials, NP; Vaughan Basta, MD; [place], MD o [address] [place], [place] [address] o [phone] o Mon-Fri 8:30-5:00, Sat 10:00-1:00 o Providers come to see babies at [facility] o Does NOT accept Medicaid o Free prenatal information session Tuesdays at 4:45pm . [facility] Luna Kitchens, MD; [place], [place]; [place], [place]; [place], [place] o [address] [place] [address] o [phone] o Mon-Fri 7:30-5:30 o Babies seen by [facility] providers . [facility] o [address], [place], [place]  [address] o [phone]   Fax - [phone]  [place] [phone] & [phone]) . [facility] Alphonsa Overall, MD o [address] [place], [place] [address] o [phone] o Mon-Thur 8:00-6:00 o Providers come to see babies at [facility] o Accepting Medicaid . [facility] Zenon Mayo, NP; Cyndia Bent, MD; [place], [place]; [place], [place] o [address] [place], [place] [address] o [phone] o Mon-Thur 7:30-7:30, Fri 7:30-4:30 o Babies seen by [facility] providers o Accepting Medicaid .  [facility] Cheryle Horsfall, MD; Janene Harvey, NP; Vonita Moss, MD o [address], [place], [place] [address] o [phone] o Mon-Fri  8:30-5:00, Sat 8:30-12:00 o Providers come to see babies at [facility] o Accepting Medicaid o Must have "Meet & Greet" appointment at office prior to delivery . [facility]) Llana Aliment, MD; Earlene Plater, MD; Lucretia Roers, MD o [address], [place], [place] [address] o [phone] o Mon-Wed 8:00-6:00, Thur-Fri 8:00-5:00, Sat 9:00-12:00 o Providers come to see babies at [facility] o Does NOT accept Medicaid o Only accepting siblings of current patients . [facility]  o [address], [place], [place]  [address] o [phone]   Fax - [phone] . [facility] o [phone] [address], [place], [place]  [address] o [phone]   Fax - [phone]  [place] [phone] & [phone]) . Nature conservation officer at [facility] o [place], DO; [place], DO o [address] [place], [place] [address] o [phone] o Mon-Fri 7:00-5:00 o Babies seen by [facility] providers o Does NOT accept Medicaid . [facility] Ellis Savage, MD; [place], [place]; [place], [place] o [address], [place], [place] [address] o [phone] o Mon-Fri 8:00-5:00 o Babies seen by [facility] providers o Accepting Medicaid . [facility] - [address] Franne Forts, MD; [place], [place]; [place], NP; [place], [place] o [address] [place], [place], [place] [address] o [phone] o Mon-Fri 8:00-5:00 o Babies seen by providers at [facility] o Accepting [facility] [place] [phone]) . [facility] [place], [place] o [address] [place], [place] [address] o [phone] o Mon-Fri 8:00-5:00 o Babies seen by [facility] providers o Does NOT accept Medicaid o Limited availability, please call early in hospitalization to schedule follow-up . [facility] Jolee Ewing, PA; Eddie Candle, MD; [place], MD; [place], [place]; Constance Goltz, MD; [place], [place] o [facility] [facility] [address], [place], [place] [address] o [phone] o Mon-Fri 8:30-5:00, Sat 9:00-12:00 o Babies seen by providers at [facility] o Accepting Medicaid o Please register online then schedule online or call office o [email or web address] . [facility]) Samuella Bruin, NP; Lucianne Muss, MD; Lanier Clam, PA o [address], [place], [place] [address] o [phone] o Mon-Fri 8:00-5:00 o Babies seen by providers at [facility] o Accepting Medicaid . [facility] [place] ([facility]) Sharin Mons, MD; Reed Breech, NP; Shelva Majestic, MD o [address], [place], [place] [address] o [phone] o Mon-Fri 8:00-5:30, Sat&Sun by appointment (phones open at 8:30) o Babies seen by [facility] providers o Accepting Medicaid o Must be a first-time baby or sibling of current patient . [facility] [address], [place], [place]  [address] o [phone]   Fax - [phone]  [place] [phone] & [phone]) . [facility] o [place], [place]; [place], [place]; Dimple Casey, MD; [place], [place]; Carolyne Fiscal, MD o [address] [place], [place] [address] o [phone] o Mon-Thur 8:00-7:00, Fri 8:00-5:00, Sat 8:00-12:00, Sun 9:00-12:00 o Babies seen by [facility] providers o Accepting Medicaid . [facility] at [facility] MD; Gaynell Face, MD; [facility], MD o [address], [place], [place] [address]  o [phone] o Mon-Thur 8:00-5:00 o Babies seen by providers at [facility] o Accepting Medicaid . [facility] [place] Gwenlyn Saran, MD; [place], MD; Madilyn Fireman, MD; Melvyn Neth, MD; List, MD; Lazarus Salines, MD; Gaynell Face, MD; Berneda Rose, MD; Flora Lipps, MD; Beryl Meager, MD; Luther Redo, MD;  Lavonia Drafts, MD; Kellie Simmering, MD o [address] [place] [place], [place] [address] o [phone] o Mon-Fri 8:00-5:30, Sat (Oct.-Mar.) 9:00-1:00 o Babies seen by providers at [facility] o Accepting Medicaid o Must fill out new patient packet, available online at [email or web address] . [facility] [place] [facility]) Simone Curia, NP; Tiburcio Pea, NP; Tresa Endo, NP; Whitney Post, MD; [place], [place]; Hennie Duos, MD; Wynne Dust, MD; Kavin Leech, NP o [address], [place], [place] [address] o [phone] o Mon-Thur 8:00-5:30, Fri 8:00-5:00 o Babies seen by providers at [facility] o Accepting [facility] [phone]) . [facility] o [place], [place]; [place], MD; Tanya Nones, MD; [place], [place] o [address] [place], [place] [address] o [phone] o Mon-Fri 8:00-5:00 o Babies seen by providers at [facility] o Accepting [facility] [phone]) . [facility] at [facility] o [place], DO; Lenise Arena, MD; [place], [place] o [address], [place], [place] [address] o [phone] o Mon-Fri 8:00-5:00 o Babies seen by providers at [facility] o Does NOT accept Medicaid o Limited appointment availability, please call early in hospitalization  . Nature conservation officer at [facility] o [place], DO; [place], MD o [address], [place], [place] [address] o [phone] o Mon-Fri 8:00-5:00 o Babies seen by [facility] providers o Does NOT accept Medicaid . [facility] Lorrine Kin, MD; Ninetta Lights, MD; [place], [place]; [place], MD o [address], [place], [place] [address] o [phone] o Mon-Fri 8:00-5:00 o After hours clinic [facility] [place], [place] [address]) [phone] Mon-Fri 5:00-8:00, Sat 12:00-6:00, Sun 10:00-4:00 o Babies seen by [facility] providers o Accepting Medicaid . [facility] at [facility] o [address] [place], [place]   [address] o [phone]   Fax - [phone]  [place] [phone]) . Nature conservation officer at [facility], MD o [address] [place], [place], [place] [address] o [phone] o Mon-Fri 8:00-5:00 o Babies seen by [facility] providers o Does NOT accept Medicaid . [facility] - [place] [facility] [place]) Tomi Likens, MD o [address] [place] [address] [place], [place] [address] o [phone] o Mon-Thur 8:00-7:00, Fri 8:00-5:00, Sat 8:00-12:00 o Babies seen by providers at [facility] o Accepting Medicaid - but does not have vaccinations in office (must be received elsewhere) o Limited availability, please call early in hospitalization  [place] ([id number]) . [facility]  o Wyvonne Lenz, MD o [address], [place] [address] o [phone]  Fax [phone]

## 2020-10-31 NOTE — Progress Notes (Signed)
Discuss tubal, and wants tdap

## 2020-11-14 ENCOUNTER — Encounter: Payer: Self-pay | Admitting: Family Medicine

## 2020-11-14 ENCOUNTER — Ambulatory Visit (INDEPENDENT_AMBULATORY_CARE_PROVIDER_SITE_OTHER): Payer: Medicaid Other | Admitting: Family Medicine

## 2020-11-14 ENCOUNTER — Other Ambulatory Visit: Payer: Self-pay

## 2020-11-14 VITALS — BP 136/84 | HR 108 | Wt 204.0 lb

## 2020-11-14 DIAGNOSIS — Z6791 Unspecified blood type, Rh negative: Secondary | ICD-10-CM

## 2020-11-14 DIAGNOSIS — O99210 Obesity complicating pregnancy, unspecified trimester: Secondary | ICD-10-CM

## 2020-11-14 DIAGNOSIS — U071 COVID-19: Secondary | ICD-10-CM

## 2020-11-14 DIAGNOSIS — O98513 Other viral diseases complicating pregnancy, third trimester: Secondary | ICD-10-CM

## 2020-11-14 DIAGNOSIS — O26899 Other specified pregnancy related conditions, unspecified trimester: Secondary | ICD-10-CM

## 2020-11-14 DIAGNOSIS — O365990 Maternal care for other known or suspected poor fetal growth, unspecified trimester, not applicable or unspecified: Secondary | ICD-10-CM

## 2020-11-14 DIAGNOSIS — Z3480 Encounter for supervision of other normal pregnancy, unspecified trimester: Secondary | ICD-10-CM

## 2020-11-14 MED ORDER — BUSPIRONE HCL 15 MG PO TABS: 15.0000 mg | ORAL_TABLET | Freq: Every day | ORAL | 3 refills | Status: DC

## 2020-11-14 NOTE — Progress Notes (Signed)
   PRENATAL VISIT NOTE  Subjective:  Laura Vaughan is a 30 y.o. [id number] at [redacted]w[redacted]d being seen today for ongoing prenatal care.  She is currently monitored for the following issues for this high-risk pregnancy and has Childhood asthma; Depression, recurrent (HCC); Obesity affecting pregnancy, antepartum; Supervision of other normal pregnancy, antepartum; Rh negative state in antepartum period; Pregnancy affected by fetal growth restriction; and COVID-19 affecting pregnancy in third trimester on their problem list.  Patient reports no complaints.  Contractions: Regular. Vag. Bleeding: None.  Movement: Present. Denies leaking of fluid.   The following portions of the patient's history were reviewed and updated as appropriate: allergies, current medications, past family history, past medical history, past social history, past surgical history and problem list.   Objective:   Vitals:   11/14/20 1351  BP: 136/84  Pulse: (!) 108  Weight: 204 lb (92.5 kg)    Fetal Status: Fetal Heart Rate (bpm): 162   Movement: Present     General:  Alert, oriented and cooperative. Patient is in no acute distress.  Skin: Skin is warm and dry. No rash noted.   Cardiovascular: Normal heart rate noted  Respiratory: Normal respiratory effort, no problems with respiration noted  Abdomen: Soft, gravid, appropriate for gestational age.  Pain/Pressure: Present     Pelvic: Cervical exam deferred        Extremities: Normal range of motion.  Edema: Trace  Mental Status: Normal mood and affect. Normal behavior. Normal judgment and thought content.   Assessment and Plan:  Pregnancy: [id number] at [redacted]w[redacted]d 1. Supervision of other normal pregnancy, antepartum Doing well not concerns  2. COVID-19 affecting pregnancy in third trimester Will need BPP weekly at 36 weks  3. Pregnancy affected by fetal growth restriction 1-5: AC 10th% and EFW 10th% 1-26: AC was 21st%, EFW 15th% Has [place] scheduled 2/16 to follow up  4. Obesity  affecting pregnancy, antepartum TWG=27 lb (12.2 kg)     5. Rh negative state in antepartum period Received rhogam  Preterm labor symptoms and general obstetric precautions including but not limited to vaginal bleeding, contractions, leaking of fluid and fetal movement were reviewed in detail with the patient. Please refer to After Visit Summary for other counseling recommendations.   Return in about 2 weeks (around 11/28/2020) for Routine prenatal care.  Future Appointments  Date Time Provider Department Center  11/21/2020  2:15 PM [facility] NURSE [facility]  11/21/2020  2:30 PM [facility] US3 [facility]  11/27/2020  2:30 PM [facility] Marks Bing, MD [facility]  12/12/2020  1:45 PM Federico Flake, MD [facility]  10/02/2021  2:30 PM Baity, Salvadore Oxford, NP [facility]    Federico Flake, MD

## 2020-11-21 ENCOUNTER — Encounter: Payer: Self-pay | Admitting: *Deleted

## 2020-11-21 ENCOUNTER — Ambulatory Visit: Payer: Medicaid Other | Attending: Obstetrics and Gynecology

## 2020-11-21 ENCOUNTER — Other Ambulatory Visit: Payer: Self-pay

## 2020-11-21 ENCOUNTER — Ambulatory Visit: Payer: Medicaid Other | Admitting: *Deleted

## 2020-11-21 DIAGNOSIS — O10013 Pre-existing essential hypertension complicating pregnancy, third trimester: Secondary | ICD-10-CM

## 2020-11-21 DIAGNOSIS — O98513 Other viral diseases complicating pregnancy, third trimester: Secondary | ICD-10-CM | POA: Insufficient documentation

## 2020-11-21 DIAGNOSIS — O365990 Maternal care for other known or suspected poor fetal growth, unspecified trimester, not applicable or unspecified: Secondary | ICD-10-CM

## 2020-11-21 DIAGNOSIS — Z362 Encounter for other antenatal screening follow-up: Secondary | ICD-10-CM | POA: Diagnosis not present

## 2020-11-21 DIAGNOSIS — O2623 Pregnancy care for patient with recurrent pregnancy loss, third trimester: Secondary | ICD-10-CM

## 2020-11-21 DIAGNOSIS — Z3A33 33 weeks gestation of pregnancy: Secondary | ICD-10-CM

## 2020-11-21 DIAGNOSIS — O365930 Maternal care for other known or suspected poor fetal growth, third trimester, not applicable or unspecified: Secondary | ICD-10-CM

## 2020-11-21 DIAGNOSIS — U071 COVID-19: Secondary | ICD-10-CM | POA: Diagnosis present

## 2020-11-21 DIAGNOSIS — Z8616 Personal history of COVID-19: Secondary | ICD-10-CM | POA: Diagnosis not present

## 2020-11-22 ENCOUNTER — Other Ambulatory Visit: Payer: Self-pay | Admitting: *Deleted

## 2020-11-22 DIAGNOSIS — O365930 Maternal care for other known or suspected poor fetal growth, third trimester, not applicable or unspecified: Secondary | ICD-10-CM

## 2020-11-27 ENCOUNTER — Encounter: Payer: Self-pay | Admitting: Obstetrics and Gynecology

## 2020-11-27 ENCOUNTER — Other Ambulatory Visit: Payer: Self-pay

## 2020-11-27 ENCOUNTER — Telehealth (INDEPENDENT_AMBULATORY_CARE_PROVIDER_SITE_OTHER): Payer: Medicaid Other | Admitting: Obstetrics and Gynecology

## 2020-11-27 VITALS — BP 124/88

## 2020-11-27 DIAGNOSIS — O98513 Other viral diseases complicating pregnancy, third trimester: Secondary | ICD-10-CM

## 2020-11-27 DIAGNOSIS — Z3A34 34 weeks gestation of pregnancy: Secondary | ICD-10-CM

## 2020-11-27 DIAGNOSIS — O0993 Supervision of high risk pregnancy, unspecified, third trimester: Secondary | ICD-10-CM | POA: Diagnosis not present

## 2020-11-27 DIAGNOSIS — U071 COVID-19: Secondary | ICD-10-CM

## 2020-11-27 DIAGNOSIS — O99513 Diseases of the respiratory system complicating pregnancy, third trimester: Secondary | ICD-10-CM

## 2020-11-27 DIAGNOSIS — O10913 Unspecified pre-existing hypertension complicating pregnancy, third trimester: Secondary | ICD-10-CM

## 2020-11-27 DIAGNOSIS — O99213 Obesity complicating pregnancy, third trimester: Secondary | ICD-10-CM

## 2020-11-27 DIAGNOSIS — O26899 Other specified pregnancy related conditions, unspecified trimester: Secondary | ICD-10-CM

## 2020-11-27 DIAGNOSIS — E669 Obesity, unspecified: Secondary | ICD-10-CM

## 2020-11-27 DIAGNOSIS — Z6791 Unspecified blood type, Rh negative: Secondary | ICD-10-CM

## 2020-11-27 DIAGNOSIS — O99210 Obesity complicating pregnancy, unspecified trimester: Secondary | ICD-10-CM

## 2020-11-27 DIAGNOSIS — O365930 Maternal care for other known or suspected poor fetal growth, third trimester, not applicable or unspecified: Secondary | ICD-10-CM

## 2020-11-27 DIAGNOSIS — O10919 Unspecified pre-existing hypertension complicating pregnancy, unspecified trimester: Secondary | ICD-10-CM | POA: Insufficient documentation

## 2020-11-27 DIAGNOSIS — O365990 Maternal care for other known or suspected poor fetal growth, unspecified trimester, not applicable or unspecified: Secondary | ICD-10-CM

## 2020-11-27 NOTE — Progress Notes (Signed)
I connected with  Joene Gelder on 11/27/20 at  2:30 PM EST by telephone and verified that I am speaking with the correct person using two identifiers.   I discussed the limitations, risks, security and privacy concerns of performing an evaluation and management service by telephone and the availability of in person appointments. I also discussed with the patient that there may be a patient responsible charge related to this service. The patient expressed understanding and agreed to proceed.  Scheryl Marten, RN 11/27/2020  2:33 PM

## 2020-11-27 NOTE — Progress Notes (Signed)
TELEHEALTH VIRTUAL OBSTETRICS VISIT ENCOUNTER NOTE  Clinic: [facility]  I connected with Laura Vaughan on 11/27/20 at  2:30 PM EST by telephone at home and verified that I am speaking with the correct person using two identifiers.   I discussed the limitations, risks, security and privacy concerns of performing an evaluation and management service by telephone and the availability of in person appointments. I also discussed with the patient that there may be a patient responsible charge related to this service. The patient expressed understanding and agreed to proceed.  Subjective:  Laura Vaughan is a 30 y.o. [id number] at [redacted]w[redacted]d being followed for ongoing prenatal care.  She is currently monitored for the following issues for this high-risk pregnancy and has Childhood asthma; Depression, recurrent (HCC); Obesity affecting pregnancy, antepartum; Supervision of other normal pregnancy, antepartum; Rh negative state in antepartum period; Pregnancy affected by fetal growth restriction; COVID-19 affecting pregnancy in third trimester; and Chronic hypertension during pregnancy, antepartum on their problem list.  Patient reports no complaints. Reports fetal movement. Denies any contractions, bleeding or leaking of fluid.   The following portions of the patient's history were reviewed and updated as appropriate: allergies, current medications, past family history, past medical history, past social history, past surgical history and problem list.   Objective:   Vitals:   11/27/20 1432  BP: 124/88    Babyscripts Data Reviewed: not applicable  General:  Alert, oriented and cooperative.   Mental Status: Normal mood and affect perceived. Normal judgment and thought content.  Rest of physical exam deferred due to type of encounter  Assessment and Plan:  Pregnancy: [id number] at [redacted]w[redacted]d 1. Pregnancy affected by borderline fetal growth restriction Stable growth on 2/16 efw 18%, ac 16%  and efw 15% on 1/26. Pt with repeat growth on 3/9  2. COVID-19 affecting pregnancy in third trimester No issues  3. Supervision of high risk pregnancy, antepartum gbs nv. Set up IOL at 39wks. See below  4. Chronic hypertension during pregnancy, antepartum She has had borderline pressures. She had pre-eclampsia with her first pregnancy. In reviewing care everywhere "cHTN" is noted in the PL and she had seen a cardiologist in November 2021 for borderline BPs, on no meds; she had a normal echo and plan for exp management. Pt missed her f/u appt earlier this month  Based on this, I told her I recommend qmonth growth u/s and delivery at 39wks which she is amenable to. No need for ap testing since controlled on no meds  5. Obesity affecting pregnancy, antepartum  6. Rh negative state in antepartum period S/p rhogam already  Preterm labor symptoms and general obstetric precautions including but not limited to vaginal bleeding, contractions, leaking of fluid and fetal movement were reviewed in detail with the patient.  I discussed the assessment and treatment plan with the patient. The patient was provided an opportunity to ask questions and all were answered. The patient agreed with the plan and demonstrated an understanding of the instructions. The patient was advised to call back or seek an in-person office evaluation/go to [facility] at [facility] for any urgent or concerning symptoms. Please refer to After Visit Summary for other counseling recommendations.   I provided 15 minutes of non-face-to-face time during this encounter. The visit was conducted via MyChart-medicine  Return in about 2 weeks (around 12/11/2020).  Future Appointments  Date Time Provider Department Center  12/12/2020  1:45 PM Federico Flake, MD [facility]  12/12/2020  2:45  PM [facility] NURSE [facility]  12/12/2020  3:00 PM [facility] US1 [facility]  10/02/2021  2:30 PM Baity, Salvadore Oxford, NP [facility]  [facility]    Shindler Bing, MD [facility]

## 2020-12-12 ENCOUNTER — Ambulatory Visit: Payer: Medicaid Other | Admitting: *Deleted

## 2020-12-12 ENCOUNTER — Other Ambulatory Visit: Payer: Self-pay

## 2020-12-12 ENCOUNTER — Ambulatory Visit: Payer: Medicaid Other | Attending: Maternal & Fetal Medicine

## 2020-12-12 ENCOUNTER — Encounter: Payer: Self-pay | Admitting: Family Medicine

## 2020-12-12 ENCOUNTER — Other Ambulatory Visit (HOSPITAL_COMMUNITY)
Admission: RE | Admit: 2020-12-12 | Discharge: 2020-12-12 | Disposition: A | Discharge status: Home / Self Care | Payer: Medicaid Other | Source: Ambulatory Visit | Attending: Family Medicine | Admitting: Family Medicine

## 2020-12-12 ENCOUNTER — Encounter: Payer: Self-pay | Admitting: *Deleted

## 2020-12-12 ENCOUNTER — Ambulatory Visit (INDEPENDENT_AMBULATORY_CARE_PROVIDER_SITE_OTHER): Payer: Medicaid Other | Admitting: Family Medicine

## 2020-12-12 VITALS — BP 139/88 | HR 98 | Wt 211.0 lb

## 2020-12-12 DIAGNOSIS — O365990 Maternal care for other known or suspected poor fetal growth, unspecified trimester, not applicable or unspecified: Secondary | ICD-10-CM

## 2020-12-12 DIAGNOSIS — O26899 Other specified pregnancy related conditions, unspecified trimester: Secondary | ICD-10-CM

## 2020-12-12 DIAGNOSIS — O99210 Obesity complicating pregnancy, unspecified trimester: Secondary | ICD-10-CM

## 2020-12-12 DIAGNOSIS — U071 COVID-19: Secondary | ICD-10-CM

## 2020-12-12 DIAGNOSIS — Z3A36 36 weeks gestation of pregnancy: Secondary | ICD-10-CM

## 2020-12-12 DIAGNOSIS — O98513 Other viral diseases complicating pregnancy, third trimester: Secondary | ICD-10-CM

## 2020-12-12 DIAGNOSIS — Z362 Encounter for other antenatal screening follow-up: Secondary | ICD-10-CM

## 2020-12-12 DIAGNOSIS — O365930 Maternal care for other known or suspected poor fetal growth, third trimester, not applicable or unspecified: Secondary | ICD-10-CM | POA: Insufficient documentation

## 2020-12-12 DIAGNOSIS — O10013 Pre-existing essential hypertension complicating pregnancy, third trimester: Secondary | ICD-10-CM | POA: Diagnosis not present

## 2020-12-12 DIAGNOSIS — O2692 Pregnancy related conditions, unspecified, second trimester: Secondary | ICD-10-CM

## 2020-12-12 DIAGNOSIS — O10919 Unspecified pre-existing hypertension complicating pregnancy, unspecified trimester: Secondary | ICD-10-CM

## 2020-12-12 DIAGNOSIS — Z3480 Encounter for supervision of other normal pregnancy, unspecified trimester: Secondary | ICD-10-CM | POA: Diagnosis not present

## 2020-12-12 DIAGNOSIS — Z6791 Unspecified blood type, Rh negative: Secondary | ICD-10-CM

## 2020-12-12 DIAGNOSIS — Z8616 Personal history of COVID-19: Secondary | ICD-10-CM

## 2020-12-12 NOTE — Progress Notes (Signed)
   PRENATAL VISIT NOTE  Subjective:  Laura Vaughan is a 30 y.o. [id number] at [redacted]w[redacted]d being seen today for ongoing prenatal care.  She is currently monitored for the following issues for this high-risk pregnancy and has Childhood asthma; Depression, recurrent (HCC); Obesity affecting pregnancy, antepartum; Supervision of other normal pregnancy, antepartum; Rh negative state in antepartum period; Pregnancy affected by fetal growth restriction; COVID-19 affecting pregnancy in third trimester; and Chronic hypertension during pregnancy, antepartum on their problem list.  Patient reports no complaints.  Contractions: Irritability. Vag. Bleeding: None.  Movement: Present. Denies leaking of fluid.   The following portions of the patient's history were reviewed and updated as appropriate: allergies, current medications, past family history, past medical history, past social history, past surgical history and problem list.   Objective:   Vitals:   12/12/20 1410  BP: 139/88  Pulse: 98  Weight: 211 lb (95.7 kg)    Fetal Status: Fetal Heart Rate (bpm): 156 Fundal Height: 35 cm Movement: Present  Presentation: Vertex  General:  Alert, oriented and cooperative. Patient is in no acute distress.  Skin: Skin is warm and dry. No rash noted.   Cardiovascular: Normal heart rate noted  Respiratory: Normal respiratory effort, no problems with respiration noted  Abdomen: Soft, gravid, appropriate for gestational age.  Pain/Pressure: Present     Pelvic: Cervical exam deferred Dilation: 1.5 Effacement (%): Thick Station: -3  Extremities: Normal range of motion.  Edema: Mild pitting, slight indentation  Mental Status: Normal mood and affect. Normal behavior. Normal judgment and thought content.   Assessment and Plan:  Pregnancy: [id number] at [redacted]w[redacted]d 1. Chronic hypertension during pregnancy, antepartum BP wnl IOL at 39 wks now that growth is WNL  2. Pregnancy affected by fetal growth restriction Has appt today with  MFM Reviewed [place]-- RESOLVED EFW 20th%  3. COVID-19 affecting pregnancy in third trimester Not hospitalized, no change in antenatal survellience  4. Obesity affecting pregnancy, antepartum TWG=34 lb (15.4 kg)   5. Supervision of other normal pregnancy, antepartum Up to date 36 wk labs today - Strep Gp B NAA - GC/Chlamydia probe amp ([place])not at [facility]  6. Rh negative state in antepartum period recieved rhogam  Preterm labor symptoms and general obstetric precautions including but not limited to vaginal bleeding, contractions, leaking of fluid and fetal movement were reviewed in detail with the patient. Please refer to After Visit Summary for other counseling recommendations.   Return in about 1 week (around 12/19/2020) for Routine prenatal care, MD or APP.  Future Appointments  Date Time Provider Department Center  12/19/2020  3:10 PM Calvert Cantor, [place] [facility]  12/26/2020 11:15 AM Federico Flake, MD [facility]  01/02/2021 11:10 AM Marylene Land, CNM [facility]  10/02/2021  2:30 PM Baity, Salvadore Oxford, NP [facility]    Federico Flake, MD

## 2020-12-13 LAB — GC/CHLAMYDIA PROBE AMP (~~LOC~~) NOT AT ARMC
Chlamydia: NEGATIVE
Comment: NEGATIVE
Comment: NORMAL
Neisseria Gonorrhea: NEGATIVE

## 2020-12-14 ENCOUNTER — Encounter: Payer: Self-pay | Admitting: Family Medicine

## 2020-12-14 DIAGNOSIS — O99820 Streptococcus B carrier state complicating pregnancy: Secondary | ICD-10-CM | POA: Insufficient documentation

## 2020-12-14 LAB — STREP GP B NAA: Strep Gp B NAA: POSITIVE — AB

## 2020-12-19 ENCOUNTER — Other Ambulatory Visit: Payer: Self-pay

## 2020-12-19 ENCOUNTER — Ambulatory Visit (INDEPENDENT_AMBULATORY_CARE_PROVIDER_SITE_OTHER): Payer: Medicaid Other | Admitting: Advanced Practice Midwife

## 2020-12-19 VITALS — BP 139/83 | HR 106 | Wt 211.0 lb

## 2020-12-19 DIAGNOSIS — O0993 Supervision of high risk pregnancy, unspecified, third trimester: Secondary | ICD-10-CM

## 2020-12-19 DIAGNOSIS — Z3A37 37 weeks gestation of pregnancy: Secondary | ICD-10-CM

## 2020-12-19 DIAGNOSIS — O10919 Unspecified pre-existing hypertension complicating pregnancy, unspecified trimester: Secondary | ICD-10-CM

## 2020-12-19 NOTE — Patient Instructions (Signed)
Labor Induction Labor induction is when steps are taken to cause a pregnant woman to begin the labor process. Most women go into labor on their own between 37 weeks and 42 weeks of pregnancy. When this does not happen, or when there is a medical need for labor to begin, steps may be taken to induce, or bring on, labor. Labor induction causes a pregnant woman's uterus to contract. It also causes the cervix to soften (ripen), open (dilate), and thin out. Usually, labor is not induced before 39 weeks of pregnancy unless there is a medical reason to do so. When is labor induction considered? Labor induction may be right for you if:  Your pregnancy lasts longer than 41 to 42 weeks.  Your placenta is separating from your uterus (placental abruption).  You have a rupture of membranes and your labor does not begin.  You have health problems, like diabetes or high blood pressure (preeclampsia) during your pregnancy.  Your baby has stopped growing or does not have enough amniotic fluid. Before labor induction begins, your health care provider will consider the following factors:  Your medical condition and the baby's condition.  How many weeks you have been pregnant.  How mature the baby's lungs are.  The condition of your cervix.  The position of the baby.  The size of your birth canal. Tell a health care provider about:  Any allergies you have.  All medicines you are taking, including vitamins, herbs, eye drops, creams, and over-the-counter medicines.  Any problems you or your family members have had with anesthetic medicines.  Any surgeries you have had.  Any blood disorders you have.  Any medical conditions you have. What are the risks? Generally, this is a safe procedure. However, problems may occur, including:  Failed induction.  Changes in fetal heart rate, such as being too high, too low, or irregular (erratic).  Infection in the mother or the baby.  Increased risk of  having a cesarean delivery.  Breaking off (abruption) of the placenta from the uterus. This is rare.  Rupture of the uterus. This is very rare.  Your baby could fail to get enough blood flow or oxygen. This can be life-threatening. When induction is needed for medical reasons, the benefits generally outweigh the risks. What happens during the procedure? During the procedure, your health care provider will use one of these methods to induce labor:  Stripping the membranes. In this method, the amniotic sac tissue is gently separated from the cervix. This causes the following to happen: ? Your cervix stretches, which in turn causes the release of prostaglandins. ? Prostaglandins induce labor and cause the uterus to contract. ? This procedure is often done in an office visit. You will be sent home to wait for contractions to begin.  Prostaglandin medicine. This medicine starts contractions and causes the cervix to dilate and ripen. This can be taken by mouth (orally) or by being inserted into the vagina (suppository).  Inserting a small, thin tube (catheter) with a balloon into the vagina and then expanding the balloon with water to dilate the cervix.  Breaking the water. In this method, a small instrument is used to make a small hole in the amniotic sac. This eventually causes the amniotic sac to break. Contractions should begin within a few hours.  Medicine to trigger or strengthen contractions. This medicine is given through an IV that is inserted into a vein in your arm. This procedure may vary among health care providers and hospitals.     Where to find more information  March of Dimes: www.marchofdimes.org  The American College of Obstetricians and Gynecologists: www.acog.org Summary  Labor induction causes a pregnant woman's uterus to contract. It also causes the cervix to soften (ripen), open (dilate), and thin out.  Labor is usually not induced before 39 weeks of pregnancy unless  there is a medical reason to do so.  When induction is needed for medical reasons, the benefits generally outweigh the risks.  Talk with your health care provider about which methods of labor induction are right for you. This information is not intended to replace advice given to you by your health care provider. Make sure you discuss any questions you have with your health care provider. Document Revised: 07/05/2020 Document Reviewed: 07/05/2020 Elsevier Patient Education  2021 Elsevier Inc.  

## 2020-12-19 NOTE — Progress Notes (Signed)
   PRENATAL VISIT NOTE  Subjective:  Laura Vaughan is a 30 y.o. [id number] at [redacted]w[redacted]d being seen today for ongoing prenatal care.  She is currently monitored for the following issues for this high-risk pregnancy and has Childhood asthma; Depression, recurrent (HCC); Obesity affecting pregnancy, antepartum; Supervision of other normal pregnancy, antepartum; Rh negative state in antepartum period; Pregnancy affected by fetal growth restriction; COVID-19 affecting pregnancy in third trimester; Chronic hypertension during pregnancy, antepartum; and GBS (group B Streptococcus carrier), +RV culture, currently pregnant on their problem list.  Patient reports fatigue and occasional contractions.  Contractions: Irritability. Vag. Bleeding: None.  Movement: Present. Denies leaking of fluid.   The following portions of the patient's history were reviewed and updated as appropriate: allergies, current medications, past family history, past medical history, past social history, past surgical history and problem list. Problem list updated.  Objective:   Vitals:   12/19/20 1526  BP: 139/83  Pulse: (!) 106  Weight: 211 lb (95.7 kg)    Fetal Status: Fetal Heart Rate (bpm): 150 Fundal Height: 37 cm Movement: Present  Presentation: Vertex  General:  Alert, oriented and cooperative. Patient is in no acute distress.  Skin: Skin is warm and dry. No rash noted.   Cardiovascular: Normal heart rate noted  Respiratory: Normal respiratory effort, no problems with respiration noted  Abdomen: Soft, gravid, appropriate for gestational age.  Pain/Pressure: Absent     Pelvic: Cervical exam performed Dilation: 1.5 Effacement (%): Thick Station: -3  Extremities: Normal range of motion.  Edema: Trace  Mental Status: Normal mood and affect. Normal behavior. Normal judgment and thought content.   Assessment and Plan:  Pregnancy: [id number] at [redacted]w[redacted]d  1. Supervision of high risk pregnancy in third trimester - No acute  concerns  2. Chronic hypertension during pregnancy, antepartum - well controlled - IOL scheduled for 39 weeks on 03/30 at midnight - Previous labors were IOLs in which patient had membranes swept then labor ensued without further intervention  3. [redacted] weeks gestation of pregnancy  Term labor symptoms and general obstetric precautions including but not limited to vaginal bleeding, contractions, leaking of fluid and fetal movement were reviewed in detail with the patient. Please refer to After Visit Summary for other counseling recommendations.  No follow-ups on file.  Future Appointments  Date Time Provider Department Center  12/26/2020 11:15 AM Federico Flake, MD [facility]  01/02/2021 12:00 AM [facility] SCHED ROOM [facility] None  01/02/2021 11:10 AM Marylene Land, CNM [facility]  10/02/2021  2:30 PM Baity, Salvadore Oxford, NP [facility]    Calvert Cantor, CNM

## 2020-12-25 ENCOUNTER — Encounter (HOSPITAL_COMMUNITY): Payer: Self-pay | Admitting: *Deleted

## 2020-12-25 ENCOUNTER — Telehealth (HOSPITAL_COMMUNITY): Payer: Self-pay | Admitting: *Deleted

## 2020-12-25 NOTE — Telephone Encounter (Signed)
Preadmission screen  

## 2020-12-26 ENCOUNTER — Other Ambulatory Visit: Payer: Self-pay

## 2020-12-26 ENCOUNTER — Other Ambulatory Visit: Payer: Self-pay | Admitting: Advanced Practice Midwife

## 2020-12-26 ENCOUNTER — Ambulatory Visit (INDEPENDENT_AMBULATORY_CARE_PROVIDER_SITE_OTHER): Payer: Medicaid Other | Admitting: Family Medicine

## 2020-12-26 ENCOUNTER — Encounter: Payer: Self-pay | Admitting: Family Medicine

## 2020-12-26 VITALS — BP 139/86 | HR 93 | Wt 212.4 lb

## 2020-12-26 DIAGNOSIS — O99210 Obesity complicating pregnancy, unspecified trimester: Secondary | ICD-10-CM

## 2020-12-26 DIAGNOSIS — O98513 Other viral diseases complicating pregnancy, third trimester: Secondary | ICD-10-CM

## 2020-12-26 DIAGNOSIS — Z3A38 38 weeks gestation of pregnancy: Secondary | ICD-10-CM

## 2020-12-26 DIAGNOSIS — Z3480 Encounter for supervision of other normal pregnancy, unspecified trimester: Secondary | ICD-10-CM

## 2020-12-26 DIAGNOSIS — U071 COVID-19: Secondary | ICD-10-CM

## 2020-12-26 DIAGNOSIS — O99820 Streptococcus B carrier state complicating pregnancy: Secondary | ICD-10-CM

## 2020-12-26 DIAGNOSIS — O365990 Maternal care for other known or suspected poor fetal growth, unspecified trimester, not applicable or unspecified: Secondary | ICD-10-CM

## 2020-12-26 DIAGNOSIS — O10919 Unspecified pre-existing hypertension complicating pregnancy, unspecified trimester: Secondary | ICD-10-CM

## 2020-12-26 NOTE — Progress Notes (Signed)
   PRENATAL VISIT NOTE  Subjective:  Laura Vaughan is a 30 y.o. [id number] at [redacted]w[redacted]d being seen today for ongoing prenatal care.  She is currently monitored for the following issues for this low-risk pregnancy and has Childhood asthma; Depression, recurrent (HCC); Obesity affecting pregnancy, antepartum; Supervision of other normal pregnancy, antepartum; Rh negative state in antepartum period; Pregnancy affected by fetal growth restriction; COVID-19 affecting pregnancy in third trimester; Chronic hypertension during pregnancy, antepartum; and GBS (group B Streptococcus carrier), +RV culture, currently pregnant on their problem list.  Patient reports no complaints.  Contractions: Irritability. Vag. Bleeding: None.  Movement: Present. Denies leaking of fluid.   The following portions of the patient's history were reviewed and updated as appropriate: allergies, current medications, past family history, past medical history, past social history, past surgical history and problem list.   Objective:   Vitals:   12/26/20 1126 12/26/20 1127  BP: (!) 137/93 139/86  Pulse: (!) 101 93  Weight: 212 lb 6.4 oz (96.3 kg)     Fetal Status: Fetal Heart Rate (bpm): 156   Movement: Present  Presentation: Vertex  General:  Alert, oriented and cooperative. Patient is in no acute distress.  Skin: Skin is warm and dry. No rash noted.   Cardiovascular: Normal heart rate noted  Respiratory: Normal respiratory effort, no problems with respiration noted  Abdomen: Soft, gravid, appropriate for gestational age.  Pain/Pressure: Present     Pelvic: Cervical exam performed in the presence of a chaperone Dilation: 1.5 Effacement (%): 50 Station: -3  Extremities: Normal range of motion.     Mental Status: Normal mood and affect. Normal behavior. Normal judgment and thought content.   Assessment and Plan:  Pregnancy: [id number] at [redacted]w[redacted]d  1. GBS (group B Streptococcus carrier), +RV culture, currently pregnant PCN in  labor  2. Chronic hypertension during pregnancy, antepartum BP at baseline, DBP 90s IOL at 39   3. Pregnancy affected by fetal growth restriction Resolved, now 20th%  4. COVID-19 affecting pregnancy in third trimester No COVID testing needed due to documented illness  5. Obesity affecting pregnancy, antepartum TWG=35 lb 6.4 oz (16.1 kg) which is above goal  6. Supervision of other normal pregnancy, antepartum Has IOL scheduled, prefers AROM to help with labor progression. This has worked with other two pregnancies.  Patient cervix is favorable. Discussed membrane sweeping today. Reviewed cochrane review data on membrane sweeping at 39 wks and then at EDD. Patient has scheduled IOL at 39 weeks. Reviewed risk of cramping, contractions, bleeding and ROM. Answered patient questions.  Term labor symptoms and general obstetric precautions including but not limited to vaginal bleeding, contractions, leaking of fluid and fetal movement were reviewed in detail with the patient. Please refer to After Visit Summary for other counseling recommendations.   Return in about 5 weeks (around 01/30/2021) for Postpartum visit.  Future Appointments  Date Time Provider Department Center  01/02/2021 12:00 AM [facility] SCHED ROOM [facility] None  01/30/2021  2:30 PM Marylene Land, CNM [facility]  10/02/2021  2:30 PM Baity, Salvadore Oxford, NP [facility]    Federico Flake, MD

## 2020-12-31 ENCOUNTER — Other Ambulatory Visit (HOSPITAL_COMMUNITY): Payer: Medicaid Other

## 2021-01-02 ENCOUNTER — Inpatient Hospital Stay (HOSPITAL_COMMUNITY)
Admission: AD | Admit: 2021-01-02 | Discharge: 2021-01-03 | DRG: 807 | Disposition: A | Discharge status: Home / Self Care | Payer: Medicaid Other | Attending: Family Medicine | Admitting: Family Medicine

## 2021-01-02 ENCOUNTER — Inpatient Hospital Stay (HOSPITAL_COMMUNITY): Payer: Medicaid Other

## 2021-01-02 ENCOUNTER — Inpatient Hospital Stay (HOSPITAL_COMMUNITY): Payer: Medicaid Other | Admitting: Anesthesiology

## 2021-01-02 ENCOUNTER — Encounter (HOSPITAL_COMMUNITY): Payer: Self-pay | Admitting: Family Medicine

## 2021-01-02 ENCOUNTER — Other Ambulatory Visit: Payer: Self-pay

## 2021-01-02 ENCOUNTER — Encounter: Payer: Medicaid Other | Admitting: Student

## 2021-01-02 DIAGNOSIS — Z30430 Encounter for insertion of intrauterine contraceptive device: Secondary | ICD-10-CM

## 2021-01-02 DIAGNOSIS — U071 COVID-19: Secondary | ICD-10-CM | POA: Diagnosis present

## 2021-01-02 DIAGNOSIS — Z6791 Unspecified blood type, Rh negative: Secondary | ICD-10-CM

## 2021-01-02 DIAGNOSIS — O99214 Obesity complicating childbirth: Secondary | ICD-10-CM | POA: Diagnosis present

## 2021-01-02 DIAGNOSIS — O99820 Streptococcus B carrier state complicating pregnancy: Secondary | ICD-10-CM

## 2021-01-02 DIAGNOSIS — Z7689 Persons encountering health services in other specified circumstances: Secondary | ICD-10-CM

## 2021-01-02 DIAGNOSIS — O26899 Other specified pregnancy related conditions, unspecified trimester: Secondary | ICD-10-CM

## 2021-01-02 DIAGNOSIS — O99210 Obesity complicating pregnancy, unspecified trimester: Secondary | ICD-10-CM | POA: Diagnosis present

## 2021-01-02 DIAGNOSIS — O98513 Other viral diseases complicating pregnancy, third trimester: Secondary | ICD-10-CM | POA: Diagnosis present

## 2021-01-02 DIAGNOSIS — O99344 Other mental disorders complicating childbirth: Secondary | ICD-10-CM | POA: Diagnosis present

## 2021-01-02 DIAGNOSIS — F32A Depression, unspecified: Secondary | ICD-10-CM | POA: Diagnosis present

## 2021-01-02 DIAGNOSIS — Z8616 Personal history of COVID-19: Secondary | ICD-10-CM | POA: Diagnosis not present

## 2021-01-02 DIAGNOSIS — Z3A39 39 weeks gestation of pregnancy: Secondary | ICD-10-CM | POA: Diagnosis not present

## 2021-01-02 DIAGNOSIS — O365990 Maternal care for other known or suspected poor fetal growth, unspecified trimester, not applicable or unspecified: Secondary | ICD-10-CM | POA: Diagnosis present

## 2021-01-02 DIAGNOSIS — O1002 Pre-existing essential hypertension complicating childbirth: Principal | ICD-10-CM | POA: Diagnosis present

## 2021-01-02 DIAGNOSIS — O1092 Unspecified pre-existing hypertension complicating childbirth: Secondary | ICD-10-CM

## 2021-01-02 DIAGNOSIS — Z3480 Encounter for supervision of other normal pregnancy, unspecified trimester: Secondary | ICD-10-CM

## 2021-01-02 DIAGNOSIS — O26893 Other specified pregnancy related conditions, third trimester: Secondary | ICD-10-CM | POA: Diagnosis present

## 2021-01-02 DIAGNOSIS — O99824 Streptococcus B carrier state complicating childbirth: Secondary | ICD-10-CM

## 2021-01-02 DIAGNOSIS — F339 Major depressive disorder, recurrent, unspecified: Secondary | ICD-10-CM | POA: Diagnosis present

## 2021-01-02 DIAGNOSIS — Z975 Presence of (intrauterine) contraceptive device: Secondary | ICD-10-CM

## 2021-01-02 DIAGNOSIS — O10919 Unspecified pre-existing hypertension complicating pregnancy, unspecified trimester: Secondary | ICD-10-CM | POA: Diagnosis present

## 2021-01-02 LAB — CBC
HCT: 33.2 % — ABNORMAL LOW (ref 36.0–46.0)
HCT: 35.4 % — ABNORMAL LOW (ref 36.0–46.0)
Hemoglobin: 12 g/dL (ref 12.0–15.0)
Hemoglobin: 12.1 g/dL (ref 12.0–15.0)
MCH: 33.8 pg (ref 26.0–34.0)
MCH: 33.1 pg (ref 26.0–34.0)
MCHC: 36.1 g/dL — ABNORMAL HIGH (ref 30.0–36.0)
MCHC: 34.2 g/dL (ref 30.0–36.0)
MCV: 93.5 fL (ref 80.0–100.0)
MCV: 96.7 fL (ref 80.0–100.0)
Platelets: 218 10*3/uL (ref 150–400)
Platelets: 193 10*3/uL (ref 150–400)
RBC: 3.55 MIL/uL — ABNORMAL LOW (ref 3.87–5.11)
RBC: 3.66 MIL/uL — ABNORMAL LOW (ref 3.87–5.11)
RDW: 13.5 % (ref 11.5–15.5)
RDW: 13.6 % (ref 11.5–15.5)
WBC: 8.3 10*3/uL (ref 4.0–10.5)
WBC: 12 10*3/uL — ABNORMAL HIGH (ref 4.0–10.5)
nRBC: 0 % (ref 0.0–0.2)
nRBC: 0 % (ref 0.0–0.2)

## 2021-01-02 LAB — COMPREHENSIVE METABOLIC PANEL
ALT: 13 U/L (ref 0–44)
AST: 18 U/L (ref 15–41)
Albumin: 2.7 g/dL — ABNORMAL LOW (ref 3.5–5.0)
Alkaline Phosphatase: 111 U/L (ref 38–126)
Anion gap: 7 (ref 5–15)
BUN: 5 mg/dL — ABNORMAL LOW (ref 6–20)
CO2: 22 mmol/L (ref 22–32)
Calcium: 8.5 mg/dL — ABNORMAL LOW (ref 8.9–10.3)
Chloride: 107 mmol/L (ref 98–111)
Creatinine, Ser: 0.47 mg/dL (ref 0.44–1.00)
GFR, Estimated: >60 mL/min (ref >60)
Glucose, Bld: 88 mg/dL (ref 70–99)
Potassium: 3.5 mmol/L (ref 3.5–5.1)
Sodium: 136 mmol/L (ref 135–145)
Total Bilirubin: 0.4 mg/dL (ref 0.3–1.2)
Total Protein: 5.9 g/dL — ABNORMAL LOW (ref 6.5–8.1)

## 2021-01-02 LAB — TYPE AND SCREEN
ABO/RH(D): O NEG
Antibody Screen: NEGATIVE
Weak D: POSITIVE

## 2021-01-02 LAB — PROTEIN / CREATININE RATIO, URINE
Creatinine, Urine: 58.8 mg/dL
Protein Creatinine Ratio: 0.12 mg/mg{Cre} (ref 0.00–0.15)
Total Protein, Urine: 7 mg/dL

## 2021-01-02 LAB — RPR: RPR Ser Ql: NONREACTIVE

## 2021-01-02 MED ORDER — OXYTOCIN-SODIUM CHLORIDE 30-0.9 UT/500ML-% IV SOLN
2.5000 [IU]/h | INTRAVENOUS | Status: DC
  Filled 2021-01-02: qty 500

## 2021-01-02 MED ORDER — ONDANSETRON HCL 4 MG PO TABS: 4.0000 mg | ORAL_TABLET | ORAL | Status: DC | PRN

## 2021-01-02 MED ORDER — ACETAMINOPHEN 325 MG PO TABS: 650.0000 mg | ORAL_TABLET | ORAL | Status: DC | PRN

## 2021-01-02 MED ORDER — BUSPIRONE HCL 15 MG PO TABS
15.0000 mg | ORAL_TABLET | Freq: Every day | ORAL | Status: DC
  Filled 2021-01-02 (×2): qty 1

## 2021-01-02 MED ORDER — SIMETHICONE 80 MG PO CHEW
80.0000 mg | CHEWABLE_TABLET | ORAL | Status: DC | PRN
Start: 2021-01-02 — End: 2021-01-04

## 2021-01-02 MED ORDER — MISOPROSTOL 25 MCG QUARTER TABLET
25.0000 ug | ORAL_TABLET | ORAL | Status: DC | PRN
  Filled 2021-01-02: qty 1

## 2021-01-02 MED ORDER — DIPHENHYDRAMINE HCL 50 MG/ML IJ SOLN: 12.5000 mg | INTRAMUSCULAR | Status: DC | PRN

## 2021-01-02 MED ORDER — EPHEDRINE 5 MG/ML INJ: 10.0000 mg | INTRAVENOUS | Status: DC | PRN

## 2021-01-02 MED ORDER — IBUPROFEN 600 MG PO TABS
600.0000 mg | ORAL_TABLET | Freq: Four times a day (QID) | ORAL | Status: DC
  Administered 2021-01-02 – 2021-01-03 (×4): 600 mg via ORAL
  Filled 2021-01-02 (×5): qty 1

## 2021-01-02 MED ORDER — OXYTOCIN BOLUS FROM INFUSION
333.0000 mL | Freq: Once | INTRAVENOUS | Status: AC
  Administered 2021-01-02: 333 mL via INTRAVENOUS

## 2021-01-02 MED ORDER — LACTATED RINGERS IV SOLN: 500.0000 mL | INTRAVENOUS | Status: DC | PRN

## 2021-01-02 MED ORDER — SOD CITRATE-CITRIC ACID 500-334 MG/5ML PO SOLN: 30.0000 mL | ORAL | Status: DC | PRN

## 2021-01-02 MED ORDER — ESCITALOPRAM OXALATE 10 MG PO TABS
10.0000 mg | ORAL_TABLET | Freq: Every day | ORAL | Status: DC
  Administered 2021-01-03: 10 mg via ORAL
  Filled 2021-01-02 (×2): qty 1

## 2021-01-02 MED ORDER — PENICILLIN G POT IN DEXTROSE 60000 UNIT/ML IV SOLN
3.0000 10*6.[IU] | INTRAVENOUS | Status: DC
  Administered 2021-01-02 (×2): 3 10*6.[IU] via INTRAVENOUS
  Filled 2021-01-02 (×2): qty 50

## 2021-01-02 MED ORDER — PRENATAL MULTIVITAMIN CH
1.0000 | ORAL_TABLET | Freq: Every day | ORAL | Status: DC
  Administered 2021-01-03: 1 via ORAL
  Filled 2021-01-02: qty 1

## 2021-01-02 MED ORDER — PHENYLEPHRINE 40 MCG/ML (10ML) SYRINGE FOR IV PUSH (FOR BLOOD PRESSURE SUPPORT): 80.0000 ug | PREFILLED_SYRINGE | INTRAVENOUS | Status: DC | PRN

## 2021-01-02 MED ORDER — ONDANSETRON HCL 4 MG/2ML IJ SOLN: 4.0000 mg | INTRAMUSCULAR | Status: DC | PRN

## 2021-01-02 MED ORDER — SODIUM CHLORIDE 0.9 % IV SOLN
5.0000 10*6.[IU] | Freq: Once | INTRAVENOUS | Status: AC
  Administered 2021-01-02: 5 10*6.[IU] via INTRAVENOUS
  Filled 2021-01-02: qty 5

## 2021-01-02 MED ORDER — FENTANYL-BUPIVACAINE-NACL 0.5-0.125-0.9 MG/250ML-% EP SOLN
12.0000 mL/h | EPIDURAL | Status: DC | PRN
  Filled 2021-01-02: qty 250

## 2021-01-02 MED ORDER — COCONUT OIL OIL: 1.0000 "application " | TOPICAL_OIL | Status: DC | PRN

## 2021-01-02 MED ORDER — ONDANSETRON HCL 4 MG/2ML IJ SOLN
4.0000 mg | Freq: Four times a day (QID) | INTRAMUSCULAR | Status: DC | PRN
Start: 2021-01-02 — End: 2021-01-02
  Administered 2021-01-02: 4 mg via INTRAVENOUS
  Filled 2021-01-02: qty 2

## 2021-01-02 MED ORDER — DIPHENHYDRAMINE HCL 25 MG PO CAPS: 25.0000 mg | ORAL_CAPSULE | Freq: Four times a day (QID) | ORAL | Status: DC | PRN

## 2021-01-02 MED ORDER — WITCH HAZEL-GLYCERIN EX PADS: 1.0000 "application " | MEDICATED_PAD | CUTANEOUS | Status: DC | PRN

## 2021-01-02 MED ORDER — TERBUTALINE SULFATE 1 MG/ML IJ SOLN: 0.2500 mg | Freq: Once | INTRAMUSCULAR | Status: DC | PRN

## 2021-01-02 MED ORDER — LEVONORGESTREL 19.5 MCG/DAY IU IUD
INTRAUTERINE_SYSTEM | Freq: Once | INTRAUTERINE | Status: AC
  Administered 2021-01-02: 1 via INTRAUTERINE
  Filled 2021-01-02: qty 1

## 2021-01-02 MED ORDER — SENNOSIDES-DOCUSATE SODIUM 8.6-50 MG PO TABS
2.0000 | ORAL_TABLET | ORAL | Status: DC
  Administered 2021-01-02: 2 via ORAL
  Filled 2021-01-02 (×2): qty 2

## 2021-01-02 MED ORDER — BENZOCAINE-MENTHOL 20-0.5 % EX AERO
1.0000 "application " | INHALATION_SPRAY | CUTANEOUS | Status: DC | PRN
  Filled 2021-01-02: qty 56

## 2021-01-02 MED ORDER — LACTATED RINGERS IV SOLN
500.0000 mL | Freq: Once | INTRAVENOUS | Status: AC
  Administered 2021-01-02: 500 mL via INTRAVENOUS

## 2021-01-02 MED ORDER — LIDOCAINE HCL (PF) 1 % IJ SOLN
INTRAMUSCULAR | Status: DC | PRN
  Administered 2021-01-02: 10 mL via EPIDURAL
  Administered 2021-01-02: 2 mL via EPIDURAL

## 2021-01-02 MED ORDER — LACTATED RINGERS IV SOLN: INTRAVENOUS | Status: DC

## 2021-01-02 MED ORDER — TETANUS-DIPHTH-ACELL PERTUSSIS 5-2.5-18.5 LF-MCG/0.5 IM SUSY: 0.5000 mL | PREFILLED_SYRINGE | Freq: Once | INTRAMUSCULAR | Status: DC

## 2021-01-02 MED ORDER — DIBUCAINE (PERIANAL) 1 % EX OINT: 1.0000 "application " | TOPICAL_OINTMENT | CUTANEOUS | Status: DC | PRN

## 2021-01-02 MED ORDER — ZOLPIDEM TARTRATE 5 MG PO TABS: 5.0000 mg | ORAL_TABLET | Freq: Every evening | ORAL | Status: DC | PRN

## 2021-01-02 MED ORDER — FENTANYL-BUPIVACAINE-NACL 0.5-0.125-0.9 MG/250ML-% EP SOLN
EPIDURAL | Status: DC | PRN
  Administered 2021-01-02: 12 mL/h via EPIDURAL

## 2021-01-02 MED ORDER — LIDOCAINE HCL (PF) 1 % IJ SOLN: 30.0000 mL | INTRAMUSCULAR | Status: DC | PRN

## 2021-01-02 NOTE — Anesthesia Procedure Notes (Signed)
Epidural Patient location during procedure: OB Start time: 01/02/2021 7:11 AM End time: 01/02/2021 7:21 AM  Staffing Anesthesiologist: Lannie Fields, DO Performed: anesthesiologist   Preanesthetic Checklist Completed: patient identified, IV checked, risks and benefits discussed, monitors and equipment checked, pre-op evaluation and timeout performed  Epidural Patient position: sitting Prep: DuraPrep and site prepped and draped Patient monitoring: continuous pulse ox, blood pressure, heart rate and cardiac monitor Approach: midline Location: L3-L4 Injection technique: LOR air  Needle:  Needle type: Tuohy  Needle gauge: 17 G Needle length: 9 cm Needle insertion depth: 7 cm Catheter type: closed end flexible Catheter size: 19 Gauge Catheter at skin depth: 12 cm Test dose: negative  Assessment Sensory level: T8 Events: blood not aspirated, injection not painful, no injection resistance, no paresthesia and negative IV test  Additional Notes Patient identified. Risks/Benefits/Options discussed with patient including but not limited to bleeding, infection, nerve damage, paralysis, failed block, incomplete pain control, headache, blood pressure changes, nausea, vomiting, reactions to medication both or allergic, itching and postpartum back pain. Confirmed with bedside nurse the patient's most recent platelet count. Confirmed with patient that they are not currently taking any anticoagulation, have any bleeding history or any family history of bleeding disorders. Patient expressed understanding and wished to proceed. All questions were answered. Sterile technique was used throughout the entire procedure. Please see nursing notes for vital signs. Test dose was given through epidural catheter and negative prior to continuing to dose epidural or start infusion. Warning signs of high block given to the patient including shortness of breath, tingling/numbness in hands, complete motor  block, or any concerning symptoms with instructions to call for help. Patient was given instructions on fall risk and not to get out of bed. All questions and concerns addressed with instructions to call with any issues or inadequate analgesia.  Reason for block:procedure for pain

## 2021-01-02 NOTE — Anesthesia Preprocedure Evaluation (Signed)
Anesthesia Evaluation  Patient identified by MRN, date of birth, ID band Patient awake    Reviewed: Allergy & Precautions, Patient's Chart, lab work & pertinent test results  Airway Mallampati: II  TM Distance: >3 FB Neck ROM: Full    Dental no notable dental hx.    Pulmonary asthma ,    Pulmonary exam normal breath sounds clear to auscultation       Cardiovascular hypertension (gest HTN), Normal cardiovascular exam Rhythm:Regular Rate:Normal     Neuro/Psych PSYCHIATRIC DISORDERS Depression negative neurological ROS     GI/Hepatic negative GI ROS, Neg liver ROS,   Endo/Other  Obesity BMI 33  Renal/GU negative Renal ROS  negative genitourinary   Musculoskeletal negative musculoskeletal ROS (+)   Abdominal (+) + obese,   Peds negative pediatric ROS (+)  Hematology negative hematology ROS (+) hct 33.2, plt 218   Anesthesia Other Findings   Reproductive/Obstetrics (+) Pregnancy                             Anesthesia Physical Anesthesia Plan  ASA: III and emergent  Anesthesia Plan: Epidural   Post-op Pain Management:    Induction:   PONV Risk Score and Plan: 2  Airway Management Planned: Natural Airway  Additional Equipment: None  Intra-op Plan:   Post-operative Plan:   Informed Consent: I have reviewed the patients History and Physical, chart, labs and discussed the procedure including the risks, benefits and alternatives for the proposed anesthesia with the patient or authorized representative who has indicated his/her understanding and acceptance.       Plan Discussed with:   Anesthesia Plan Comments:         Anesthesia Quick Evaluation

## 2021-01-02 NOTE — Progress Notes (Addendum)
LABOR PROGRESS NOTE  Laura Vaughan is a 30 y.o. [id number] at [redacted]w[redacted]d  admitted for IOL d/t cHTN (no meds).  Subjective: Pt is resting comfortably in bed.  Objective: BP 130/83   Pulse 86   Temp 97.9 F (36.6 C) (Oral)   Resp 17   Ht 5\' 8"  (1.727 m)   Wt 98.5 kg   LMP 04/03/2020 (Exact Date)   BMI 33.02 kg/m  or  Vitals:   01/02/21 0100 01/02/21 0227 01/02/21 0435 01/02/21 0502  BP: (!) 145/93 (!) 158/99 134/82 130/83  Pulse: 99 90 82 86  Resp: 17 18  17   Temp:  98.1 F (36.7 C) 97.9 F (36.6 C)   TempSrc:  Oral Oral   Weight:      Height:        Dilation: 5 Effacement (%): 70 Station: -1 Presentation: Vertex Exam by:: Dr. : baseline rate 125, moderate varibility, acels present, no decels Toco: contractions q2-3 mins  Labs: Lab Results  Component Value Date   WBC 8.3 01/02/2021   HGB 12.0 01/02/2021   HCT 33.2 (L) 01/02/2021   MCV 93.5 01/02/2021   PLT 218 01/02/2021    Patient Active Problem List   Diagnosis Date Noted  . Chronic hypertension affecting pregnancy 01/02/2021  . GBS (group B Streptococcus carrier), +RV culture, currently pregnant 12/14/2020  . Chronic hypertension during pregnancy, antepartum 11/27/2020  . Pregnancy affected by fetal growth restriction 10/17/2020  . COVID-19 affecting pregnancy in third trimester 10/17/2020  . Obesity affecting pregnancy, antepartum 10/03/2020  . Supervision of other normal pregnancy, antepartum 10/03/2020  . Rh negative state in antepartum period 10/03/2020  . Childhood asthma 10/02/2020  . Depression, recurrent (HCC) 10/02/2020    Assessment / Plan: 30 y.o. 10/04/2020 at [redacted]w[redacted]d here for IOL d/t cHTN (no meds).  #IOL: AROM 0215. Cervical exam 5/70/-1 0615 with appropriate change. Recheck in 4 hours. #Pain: PRN #FWB: Cat 1 #ID: GBS pos, treated with PCN #MOF: breast #MOC: Discussed risks/benefits of BTL and IUD. Pt prefers postplacental IUD. Liletta ordered and consented. #Circ: yes  #cHTN:  not on meds, asymptomatic. Multiple mild and moderate range pressures since admission- 142/106 at 0634. Platelets and creat wnl, UPC pending. Continue to monitor and initiate IV labetalol if needed.  #Rh neg: received rhogam. Rhogam eval postpartum.  #Depression: Mood stable. Previously on lexapro and buspar, patient self discontinued in the setting of FGR. Patient amendable to restarting postpartum.  #BMI [address] MS3 01/02/2021, 6:41 AM  GME ATTESTATION:  I saw and evaluated the patient. I agree with the findings and the plan of care as documented in the medical student's note.  [address], MD OB Fellow, [facility], [facility] for [facility] 01/02/2021 8:22 AM

## 2021-01-02 NOTE — Lactation Note (Signed)
This note was copied from a baby's chart. Lactation Consultation Note  Patient Name: Laura Vaughan VANVB'T Date: 01/02/2021 Reason for consult: L&D Initial assessment;Term Age:28 hours  Infant seen in L&D. Infant was skin-to-skin with head turned to the side when I entered; I noted clear fluid draining from infant's mouth. Infant's face/mouth was wiped. Latching was attempted, but unsuccessful b/c of infant's crying. Nose also required suctioning. L&D RN given update.   Lurline Hare [facility] 01/02/2021, 2:07 PM

## 2021-01-02 NOTE — H&P (Signed)
OBSTETRIC ADMISSION HISTORY AND PHYSICAL  Laura Vaughan is a 30 y.o. female [phone] with IUP at [redacted]w[redacted]d by LMP presenting for -IOL-cHTN (no meds). She reports +FMs, No LOF, no VB, no blurry vision, headaches or peripheral edema, and RUQ pain.  She plans on breast feeding. She is deciding between PP BTL (papers signed) and liletta for birth control. She received her prenatal care at [facility]   Dating: By LMP --->  Estimated Date of Delivery: 01/08/21  Sono:    12/12/20@[redacted]w[redacted]d , CWD, normal anatomy, cephalic presentation, posterior fundal placental lie, 2539g, 20% EFW   Prenatal History/Complications:  GBS positive Previously FGR 10%ile on anatomy scan (resolved--EFW 20%ile, nml dopplers) cHTN (no meds) Depression (lexapro/buspar) Rh neg COVID in pregnancy (+10/13/20)  Past Medical History: Past Medical History:  Diagnosis Date  . Asthma   . Depression   . Hypertension     Past Surgical History: Past Surgical History:  Procedure Laterality Date  . EYE SURGERY     cateracts    Obstetrical History: OB History    Gravida  7   Para  2   Term  2   Preterm      AB  4   Living  2     SAB  4   IAB      Ectopic      Multiple      Live Births  2        Obstetric Comments  Had Preeclampsia in 2014-- not on magnesium per her recall        Social History Social History   Socioeconomic History  . Marital status: Legally Separated    Spouse name: Not on file  . Number of children: 2  . Years of education: Not on file  . Highest education level: Not on file  Occupational History  . Not on file  Tobacco Use  . Smoking status: Never Smoker  . Smokeless tobacco: Never Used  Vaping Use  . Vaping Use: Never used  Substance and Sexual Activity  . Alcohol use: Not Currently  . Drug use: Never  . Sexual activity: Yes    Birth control/protection: None  Other Topics Concern  . Not on file  Social History Narrative  . Not on file   Social Determinants of Health    Financial Resource Strain: Not on file  Food Insecurity: Not on file  Transportation Needs: Not on file  Physical Activity: Not on file  Stress: Not on file  Social Connections: Not on file    Family History: Family History  Problem Relation Age of Onset  . Depression Mother   . Miscarriages / [place] Mother   . Depression Father   . Hypertension Father   . Stroke Father   . Aneurysm Father   . Drug abuse Brother   . Asthma Brother   . Heart disease Maternal Grandmother   . Hypertension Maternal Grandmother   . Early death Maternal Grandfather   . Drug abuse Maternal Grandfather     Allergies: No Known Allergies  Medications Prior to Admission  Medication Sig Dispense Refill Last Dose  . Prenatal Vit-Fe Fumarate-FA (PRENATAL MULTIVITAMIN) TABS tablet Take 1 tablet by mouth daily at 12 noon.   01/01/2021 at Unknown time  . albuterol (PROAIR HFA) 108 (90 Base) MCG/ACT inhaler Inhale 2 puffs into the lungs every 6 (six) hours as needed for wheezing or shortness of breath. 8 g 2 Unknown at Unknown time  . aspirin 81 MG chewable tablet  Chew by mouth.   More than a month at Unknown time  . busPIRone (BUSPAR) 15 MG tablet Take 1 tablet (15 mg total) by mouth daily. 30 tablet 3 More than a month at Unknown time  . Doxylamine-Pyridoxine 10-10 MG TBEC Take 1 tablet at bedtime daily.  If needed may increase to 2 tablets at bedtime, 1 tablet in the morning and 1 tablet in the afternoon.  Maximum of 4 tablets daily. (Patient not taking: Reported on 10/31/2020)     . escitalopram (LEXAPRO) 10 MG tablet Take 1 tablet (10 mg total) by mouth at bedtime. 90 tablet 1 More than a month at Unknown time  . fluticasone (FLONASE) 50 MCG/ACT nasal spray Place 2 sprays into both nostrils daily. (Patient not taking: Reported on 10/31/2020) 11.1 mL 0      Review of Systems   All systems reviewed and negative except as stated in HPI  Last menstrual period 04/03/2020. General appearance: alert,  cooperative and no distress Lungs: normal respiratory effort Heart: regular rate and rhythm Abdomen: soft, non-tender; gravid Pelvic: as noted below Extremities: Homans sign is negative, no sign of DVT Presentation: cephalic by cervical exam Fetal monitoringBaseline: 150 bpm, Variability: Good {> 6 bpm), Accelerations: Reactive and Decelerations: Absent Uterine activityNone     Prenatal labs: ABO, Rh: O/Negative/-- (08/25 0000) Antibody: Negative (01/20 0939) Rubella: Immune (08/25 0000) RPR: Non Reactive (01/20 0944)  HBsAg: Negative (08/25 0000)  HIV: Non Reactive (01/20 0944)  GBS: Positive/-- (03/09 1500)  2 hr Glucola passed Genetic screening normal per patient (no records) Anatomy US normal except FGR 10%ile  Prenatal Transfer Tool  Maternal Diabetes: No Genetic Screening: Normal per patient report Maternal Ultrasounds/Referrals: Normal Fetal Ultrasounds or other Referrals:  None Maternal Substance Abuse:  No Significant Maternal Medications:  Meds include: Other: lexapro and buspar Significant Maternal Lab Results: Group B Strep positive and Rh negative  No results found for this or any previous visit (from the past 24 hour(s)).  Patient Active Problem List   Diagnosis Date Noted  . Chronic hypertension affecting pregnancy 01/02/2021  . GBS (group B Streptococcus carrier), +RV culture, currently pregnant 12/14/2020  . Chronic hypertension during pregnancy, antepartum 11/27/2020  . Pregnancy affected by fetal growth restriction 10/17/2020  . COVID-19 affecting pregnancy in third trimester 10/17/2020  . Obesity affecting pregnancy, antepartum 10/03/2020  . Supervision of other normal pregnancy, antepartum 10/03/2020  . Rh negative state in antepartum period 10/03/2020  . Childhood asthma 10/02/2020  . Depression, recurrent (HCC) 10/02/2020    Assessment/Plan:  Laura Vaughan is a 30 y.o. [id number] at [redacted]w[redacted]d here for IOL-cHTN.  #IOL: Discussed IOL process with  patient. Given cervical exam discussed initiating pitocin vs AROM and given success of patient's prior IOLs with AROM elected for this. AROM performed with clear fluid. Will re-check in 4 hours and start pitocin if unchanged. #Pain: PRN #FWB: Cat 1 #ID: GBS pos, PCN #MOF: breast #MOC: PP BTL (signed) vs liletta #Circ: yes  #cHTN: not on meds, asymptomatic. BP on admit 151/101, repeat 145/100. PreE labs pending. Continue to monitor.  #Rh neg: received rhogam. Rhogam eval postpartum.  #Depression: Mood stable. Previously on lexapro and buspar, patient self discontinued in the setting of FGR. Patient amendable to restarting postpartum.  #BMI 32  Alric Seton, MD  01/02/2021, 12:38 AM

## 2021-01-02 NOTE — Plan of Care (Signed)
Met Wyvonnia Lora

## 2021-01-03 ENCOUNTER — Other Ambulatory Visit: Payer: Self-pay | Admitting: Family Medicine

## 2021-01-03 LAB — KLEIHAUER-BETKE STAIN
# Vials RhIg: 1
Fetal Cells %: 0 %
Quantitation Fetal Hemoglobin: 0 mL

## 2021-01-03 MED ORDER — IBUPROFEN 600 MG PO TABS: 600.0000 mg | ORAL_TABLET | Freq: Four times a day (QID) | ORAL | 0 refills | Status: DC | PRN

## 2021-01-03 MED ORDER — AMLODIPINE BESYLATE 5 MG PO TABS
5.0000 mg | ORAL_TABLET | Freq: Every day | ORAL | Status: DC
  Administered 2021-01-03: 5 mg via ORAL
  Filled 2021-01-03: qty 1

## 2021-01-03 MED ORDER — AMLODIPINE BESYLATE 5 MG PO TABS: 5.0000 mg | ORAL_TABLET | Freq: Every day | ORAL | 3 refills | Status: DC

## 2021-01-03 MED ORDER — ESCITALOPRAM OXALATE 10 MG PO TABS: 10.0000 mg | ORAL_TABLET | Freq: Every day | ORAL | 3 refills | Status: DC

## 2021-01-03 MED ORDER — RHO D IMMUNE GLOBULIN 1500 UNIT/2ML IJ SOSY
300.0000 ug | PREFILLED_SYRINGE | Freq: Once | INTRAMUSCULAR | Status: AC
  Administered 2021-01-03: 300 ug via INTRAVENOUS
  Filled 2021-01-03: qty 2

## 2021-01-03 MED ORDER — ACETAMINOPHEN 500 MG PO TABS: 1000.0000 mg | ORAL_TABLET | Freq: Four times a day (QID) | ORAL | Status: DC | PRN

## 2021-01-03 MED ORDER — COCONUT OIL OIL: 1.0000 "application " | TOPICAL_OIL | 0 refills | Status: DC | PRN

## 2021-01-03 MED FILL — ESCITALOPRAM 10 MG TABLET: 10 | 30 days supply | Qty: 30 | Fill #0

## 2021-01-03 MED FILL — AMLODIPINE BESYLATE 5 MG TA: 5 | 30 days supply | Qty: 30 | Fill #0

## 2021-01-03 NOTE — Lactation Note (Addendum)
This note was copied from a baby's chart. Lactation Consultation Note Baby 12 hrs old. Mom holding baby STS. Mom stated he fed right before his bath. Baby resting on mom's chest STS. Encouraged mom to call for [facility] for next feeding for assistance. Mom has 7 and 28 yr old that she BF. Newborn feeding habits and behavior reviewed. Lactation brochure given.  Patient Name: Laura Vaughan Date: 01/03/2021 Reason for consult: Initial assessment;Term Age:39 hours  Maternal Data Does the patient have breastfeeding experience prior to this delivery?: Yes  Feeding    LATCH Score                    Lactation Tools Discussed/Used    Interventions    Discharge    Consult Status Consult Status: Follow-up Date: 01/03/21 Follow-up type: In-patient    Charyl Dancer 01/03/2021, 1:53 AM

## 2021-01-03 NOTE — Discharge Instructions (Signed)
-take tylenol 1000 mg every 6 hours as needed for pain, alternate with ibuprofen 600 mg every 6 hours -drink plenty of water to help with breastfeeding -continue prenatal vitamins while you are breastfeeding   Postpartum Care After Vaginal Delivery The following information offers guidance about how to care for yourself from the time you deliver your baby to 6-12 weeks after delivery (postpartum period). If you have problems or questions, contact your health care provider for more specific instructions. Follow these instructions at home: Vaginal bleeding  It is normal to have vaginal bleeding (lochia) after delivery. Wear a sanitary pad for bleeding and discharge. ? During the first week after delivery, the amount and appearance of lochia is often similar to a menstrual period. ? Over the next few weeks, it will gradually decrease to a dry, yellow-brown discharge. ? For most women, lochia stops completely by 4-6 weeks after delivery, but can vary.  Change your sanitary pads frequently. Watch for any changes in your flow, such as: ? A sudden increase in volume. ? A change in color. ? Large blood clots.  If you pass a blood clot from your vagina, save it and call your health care provider. Do not flush blood clots down the toilet before talking with your health care provider.  Do not use tampons or douches until your health care provider approves.  If you are not breastfeeding, your period should return 6-8 weeks after delivery. If you are feeding your baby breast milk only, your period may not return until you stop breastfeeding. Perineal care  Keep the area between the vagina and the anus (perineum) clean and dry. Use medicated pads and pain-relieving sprays and creams as directed.  If you had a surgical cut in the perineum (episiotomy) or a tear, check the area for signs of infection until you are healed. Check for: ? More redness, swelling, or pain. ? Fluid or blood coming from the  cut or tear. ? Warmth. ? Pus or a bad smell.  You may be given a squirt bottle to use instead of wiping to clean the perineum area after you use the bathroom. Pat the area gently to dry it.  To relieve pain caused by an episiotomy, a tear, or swollen veins in the anus (hemorrhoids), take a warm sitz bath 2-3 times a day. In a sitz bath, the warm water should only come up to your hips and cover your buttocks.   Breast care  In the first few days after delivery, your breasts may feel heavy, full, and uncomfortable (breast engorgement). Milk may also leak from your breasts. Ask your health care provider about ways to help relieve the discomfort.  If you are breastfeeding: ? Wear a bra that supports your breasts and fits well. Use breast pads to absorb milk that leaks. ? Keep your nipples clean and dry. Apply creams and ointments as told. ? You may have uterine contractions every time you breastfeed for up to several weeks after delivery. This helps your uterus return to its normal size. ? If you have any problems with breastfeeding, notify your health care provider or lactation consultant.  If you are not breastfeeding: ? Avoid touching your breasts. Do not squeeze out (express) milk. Doing this can make your breasts produce more milk. ? Wear a good-fitting bra and use cold packs to help with swelling. Intimacy and sexuality  Ask your health care provider when you can engage in sexual activity. This may depend upon: ? Your risk of  infection. ? How fast you are healing. ? Your comfort and desire to engage in sexual activity.  You are able to get pregnant after delivery, even if you have not had your period. Talk with your health care provider about methods of birth control (contraception) or family planning if you desire future pregnancies. Medicines  Take over-the-counter and prescription medicines only as told by your health care provider.  Take an over-the-counter stool softener to  help ease bowel movements as told by your health care provider.  If you were prescribed an antibiotic medicine, take it as told by your health care provider. Do not stop taking the antibiotic even if you start to feel better.  Review all previous and current prescriptions to check for possible transfer into breast milk. Activity  Gradually return to your normal activities as told by your health care provider.  Rest as much as possible. Nap while your baby is sleeping. Eating and drinking  Drink enough fluid to keep your urine pale yellow.  To help prevent or relieve constipation, eat high-fiber foods every day.  Choose healthy eating to support breastfeeding or weight loss goals.  Take your prenatal vitamins until your health care provider tells you to stop.   General tips/recommendations  Do not use any products that contain nicotine or tobacco. These products include cigarettes, chewing tobacco, and vaping devices, such as e-cigarettes. If you need help quitting, ask your health care provider.  Do not drink alcohol, especially if you are breastfeeding.  Do not take medications or drugs that are not prescribed to you, especially if you are breastfeeding.  Visit your health care provider for a postpartum checkup within the first 3-6 weeks after delivery.  Complete a comprehensive postpartum visit no later than 12 weeks after delivery.  Keep all follow-up visits for you and your baby. Contact a health care provider if:  You feel unusually sad or worried.  Your breasts become red, painful, or hard.  You have a fever or other signs of an infection.  You have bleeding that is soaking through one pad an hour or you have blood clots.  You have a severe headache that doesn't go away or you have vision changes.  You have nausea and vomiting and are unable to eat or drink anything for 24 hours. Get help right away if:  You have chest pain or difficulty breathing.  You have  sudden, severe leg pain.  You faint or have a seizure.  You have thoughts about hurting yourself or your baby. If you ever feel like you may hurt yourself or others, or have thoughts about taking your own life, get help right away. Go to your nearest emergency department or:  Call your local emergency services (911 in the [place]  The National Suicide Prevention Lifeline at 732-176-1902. This suicide crisis helpline is open 24 hours a day.  Text the Crisis Text Line at 571-614-5023 (in the [place].). Summary  The period of time after you deliver your newborn up to 6-12 weeks after delivery is called the postpartum period.  Keep all follow-up visits for you and your baby.  Review all previous and current prescriptions to check for possible transfer into breast milk.  Contact a health care provider if you feel unusually sad or worried during the postpartum period. This information is not intended to replace advice given to you by your health care provider. Make sure you discuss any questions you have with your health care provider. Document Revised: 06/07/2020 Document  Reviewed: 06/07/2020 Elsevier Patient Education  2021 [employer].

## 2021-01-03 NOTE — Lactation Note (Signed)
This note was copied from a baby's chart. Lactation Consultation Note  Patient Name: Laura Vaughan WYOVZ'C Date: 01/03/2021 Reason for consult: Follow-up assessment;Mother's request;Term;Other (Comment) (Hypertension) Age:28 hours  LC not able to see a latch as infant is sleeping last, feeding 1 hr prior to visit. Mom stated some pain with latching but relieved with adjustments. Mom has some erythema on the left nipple but no other signs of trauma or abrasion. Mom has personal nipple butter to apply once at home. LC to alert RN to provide coconut oil for nipple care.   LC reviewed different positions and how to ensure infant has a deep latch.   Plan 1. To feed based on cues 8-12x in 24 hr period, offer both breasts and look for signs of milk transfer.             2. Mom to offer EBM via spoon if unable to get infant to latch           3. Mom to use coconut oil for wound care.  Mom to call RN or LC for assistance with latch if any pain or discomfort.  LC talked with Dr. Kennedy Bucker about maintenance meds Mom will restart on discharge. Lexapro (L2), Buspar (L3) and Amlodipine (L3) Hale criteria.  Lactmed (2018) Amlodipine (milk levels of amlodipine are usually low and plasma levels in breastfed infants are undetectable.)  Maternal Data    Feeding Mother's Current Feeding Choice: Breast Milk  LATCH Score                    Lactation Tools Discussed/Used    Interventions Interventions: Breast feeding basics reviewed;Position options;Breast massage;Breast compression;Education  Discharge Pump: Personal  Consult Status Consult Status: Follow-up Date: 01/04/21 Follow-up type: In-patient    Bellamia Ferch  Nicholson-Springer 01/03/2021, 12:23 PM

## 2021-01-03 NOTE — Social Work (Signed)
CSW received consult for hx of Depression.  CSW met with MOB to offer support and complete assessment.     CSW introduced self and role. CSW observed infant sleeping on back, propped with a pillow. CSW introduced self and role. MOB was engaged and pleasant during assessment. CSW informed MOB of reason for consult and assessed current mood. MOB reported she is currently feeling pretty good. MOB stated she had a good pregnancy, although she had some complications with blood pressure. CSW asked MOB how the pregnancy was emotionally. MOB disclosed it was a little rough, due to little support. CSW asked MOB who she identifies as supports. MOB stated her mom and two really close friends have been helping her through the pregnancy. CSW discussed MOB mental health diagnosis. MOB reported she was diagnosed with depression at age 30. MOB stated she is currently taking Lexapro 11m and Buspar 124mto help manage. [facility] CSW asked MOB if she plans to continue therapy postpartum. MOB shared she would like to, and was receptive to mental health resources provided by CSW. MOB denies any current SI, HI or being involved in DV.   CSW provided education regarding the baby blues period versus PPD and previously provided resources. CSW provided the New Mom Checklist and encouraged MOB to self evaluate and contact a medical professional if symptoms are noted at any time. 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 MOB reported she is unable to recall exactly how long it lasted, considering she struggles with depression in general. MOB expressed she did not seek treatment and really didn't talk about it. CSW provided review of Sudden Infant Death Syndrome (SIDS) precautions. MOB reported she has all essentials for infant, including a bassinet. MOB denies any [facility] having any additional  needs at this time.  CSW identifies no further need for intervention and no barriers to discharge at this time.  ChDarra LisLCRed Oakork WoEnterprise Productsnd ChMolson [phone]

## 2021-01-03 NOTE — Anesthesia Postprocedure Evaluation (Signed)
Anesthesia Post Note  Patient: Laura Vaughan  Procedure(s) Performed: AN AD HOC LABOR EPIDURAL     Patient location during evaluation: Mother Baby Anesthesia Type: Epidural Level of consciousness: awake, awake and alert and oriented Pain management: pain level controlled Vital Signs Assessment: post-procedure vital signs reviewed and stable Respiratory status: spontaneous breathing and respiratory function stable Cardiovascular status: blood pressure returned to baseline Postop Assessment: no headache, epidural receding, patient able to bend at knees, adequate PO intake, no backache, no apparent nausea or vomiting and able to ambulate Anesthetic complications: no   No complications documented.  Last Vitals:  Vitals:   01/03/21 0100 01/03/21 0515  BP: 137/86 126/81  Pulse: (!) 102 99  Resp: 18 18  Temp: 36.8 C 36.9 C  SpO2: 95% 98%    Last Pain:  Vitals:   01/03/21 0515  TempSrc: Oral  PainSc: 3    Pain Goal: Patients Stated Pain Goal: 1 (01/02/21 1000)                 Cleda Clarks

## 2021-01-04 LAB — RH IG WORKUP (INCLUDES ABO/RH)
ABO/RH(D): O NEG
Fetal Screen: UNDETERMINED
Gestational Age(Wks): 39
Unit division: 0

## 2021-01-04 LAB — SURGICAL PATHOLOGY

## 2021-01-07 DIAGNOSIS — Z975 Presence of (intrauterine) contraceptive device: Secondary | ICD-10-CM

## 2021-01-07 NOTE — Discharge Summary (Signed)
Postpartum Discharge Summary  Date of Service updated 01/03/21     Patient Name: Laura Vaughan DOB: August 22, 1991 MRN: [id number]  Date of admission: 01/02/2021 Delivery date:01/02/2021  Delivering provider: Gifford Shave  Date of discharge: 01/07/2021  Admitting diagnosis: Chronic hypertension affecting pregnancy [O10.919] Intrauterine pregnancy: [redacted]w[redacted]d    Secondary diagnosis:  Active Problems:   Depression, recurrent (HSan Ysidro   Obesity affecting pregnancy, antepartum   Supervision of other normal pregnancy, antepartum   Rh negative state in antepartum period   Pregnancy affected by fetal growth restriction   COVID-19 affecting pregnancy in third trimester   Chronic hypertension during pregnancy, antepartum   GBS (group B Streptococcus carrier), +RV culture, currently pregnant   Chronic hypertension affecting pregnancy   Encounter for IUD insertion  Additional problems: none    Discharge diagnosis: Term Pregnancy Delivered and CHTN                                              Post partum procedures:post placental liletta Augmentation: AROM Complications: None  Hospital course: Induction of Labor With Vaginal Delivery   30y.o. yo [id number] admitted to the hospital 01/02/2021 for induction of labor.  Indication for induction: cHTN.  Patient had an uncomplicated labor course as follows: Membrane Rupture Time/Date: 2:24 AM ,01/02/2021   Delivery Method:Vaginal, Spontaneous  Episiotomy: None  Lacerations:    Details of delivery can be found in separate delivery note.  Patient had a routine postpartum course. Patient is discharged home 01/07/21.  Newborn Data: Birth date:01/02/2021  Birth time:1:12 PM  Gender:Female  Living status:Living  Apgars:9 ,9  Weight:3274 g   Magnesium Sulfate received: No BMZ received: No Rhophylac:Yes MMR:N/A T-DaP:Given prenatally Flu: No Transfusion:No  Physical exam  Vitals:   01/02/21 1650 01/02/21 2050 01/03/21 0100 01/03/21  0515  BP: (!) 138/92 (!) 141/85 137/86 126/81  Pulse: 80 97 (!) 102 99  Resp: '16 18 18 18  ' Temp: 98.2 F (36.8 C) 98.6 F (37 C) 98.3 F (36.8 C) 98.4 F (36.9 C)  TempSrc: Oral Oral Oral Oral  SpO2:  96% 95% 98%  Weight:      Height:       General: alert, cooperative and no distress Lochia: appropriate Uterine Fundus: firm Incision: N/A DVT Evaluation: No evidence of DVT seen on physical exam. Labs: Lab Results  Component Value Date   WBC 12.0 (H) 01/02/2021   HGB 12.1 01/02/2021   HCT 35.4 (L) 01/02/2021   MCV 96.7 01/02/2021   PLT 193 01/02/2021   CMP Latest Ref Rng & Units 01/02/2021  Glucose 70 - 99 mg/dL 88  BUN 6 - 20 mg/dL 5(L)  Creatinine 0.44 - 1.00 mg/dL 0.47  Sodium 135 - 145 mmol/L 136  Potassium 3.5 - 5.1 mmol/L 3.5  Chloride 98 - 111 mmol/L 107  CO2 22 - 32 mmol/L 22  Calcium 8.9 - 10.3 mg/dL 8.5(L)  Total Protein 6.5 - 8.1 g/dL 5.9(L)  Total Bilirubin 0.3 - 1.2 mg/dL 0.4  Alkaline Phos 38 - 126 U/L 111  AST 15 - 41 U/L 18  ALT 0 - 44 U/L 13   Edinburgh Score: Edinburgh Postnatal Depression Scale Screening Tool 01/02/2021  I have been able to laugh and see the funny side of things. 0  I have looked forward with enjoyment to things. 1  I have  blamed myself unnecessarily when things went wrong. 1  I have been anxious or worried for no good reason. 1  I have felt scared or panicky for no good reason. 0  Things have been getting on top of me. 1  I have been so unhappy that I have had difficulty sleeping. 0  I have felt sad or miserable. 0  I have been so unhappy that I have been crying. 0  The thought of harming myself has occurred to me. 0  Edinburgh Postnatal Depression Scale Total 4     After visit meds:  Allergies as of 01/03/2021   No Known Allergies     Medication List    TAKE these medications   acetaminophen 500 MG tablet Commonly known as: TYLENOL Take 2 tablets (1,000 mg total) by mouth every 6 (six) hours as needed (for pain  scale < 4).   albuterol 108 (90 Base) MCG/ACT inhaler Commonly known as: ProAir HFA Inhale 2 puffs into the lungs every 6 (six) hours as needed for wheezing or shortness of breath.   amLODipine 5 MG tablet Commonly known as: NORVASC Take 1 tablet (5 mg total) by mouth daily.   busPIRone 15 MG tablet Commonly known as: BUSPAR Take 1 tablet (15 mg total) by mouth daily.   coconut oil Oil Apply 1 application topically as needed.   escitalopram 10 MG tablet Commonly known as: LEXAPRO Take 1 tablet (10 mg total) by mouth daily. What changed: when to take this   fluticasone 50 MCG/ACT nasal spray Commonly known as: FLONASE Place 2 sprays into both nostrils daily.   ibuprofen 600 MG tablet Commonly known as: ADVIL Take 1 tablet (600 mg total) by mouth every 6 (six) hours as needed.   prenatal multivitamin Tabs tablet Take 1 tablet by mouth daily at 12 noon.        Discharge home in stable condition Infant Feeding: Breast Infant Disposition:home with mother Discharge instruction: per After Visit Summary and Postpartum booklet. Activity: Advance as tolerated. Pelvic rest for 6 weeks.  Diet: routine diet Future Appointments: Future Appointments  Date Time Provider [place]  01/30/2021  2:30 PM Starr Lake, CNM [facility]  10/02/2021  2:30 PM Baity, Coralie Keens, NP [facility]   Follow up Visit: Message sent to [place] 01/07/21 by Sylvester Harder.   Please schedule this patient for a In person postpartum visit in 6 weeks with the following provider: Any provider. Additional Postpartum F/U:BP check 1 week  High risk pregnancy complicated by: HTN Delivery mode:  Vaginal, Spontaneous  Anticipated Birth Control:  PP IUD placed, needs string check at postpartum appt   2/0/2542 Arrie Senate, MD

## 2021-01-10 ENCOUNTER — Ambulatory Visit (INDEPENDENT_AMBULATORY_CARE_PROVIDER_SITE_OTHER): Payer: Medicaid Other | Admitting: *Deleted

## 2021-01-10 ENCOUNTER — Other Ambulatory Visit: Payer: Self-pay

## 2021-01-10 DIAGNOSIS — O10919 Unspecified pre-existing hypertension complicating pregnancy, unspecified trimester: Secondary | ICD-10-CM

## 2021-01-10 NOTE — Progress Notes (Signed)
Subjective:  Laura Vaughan is a 30 y.o. female here for BP check.   Hypertension ROS: taking medications as instructed, no medication side effects noted, no TIA's, no chest pain on exertion, no dyspnea on exertion and no swelling of ankles.    Objective:  BP 130/81   Pulse 60   Appearance alert, well appearing, and in no distress. General exam BP noted to be well controlled today in office and patient usually takes BP med around lunchtime.    Assessment:   Blood Pressure well controlled.   Plan:  Follow up as needed and or at postpartum visit.Marland Kitchen

## 2021-01-10 NOTE — Progress Notes (Signed)
Patient seen and assessed by nursing staff.  Agree with documentation and plan.  

## 2021-01-30 ENCOUNTER — Ambulatory Visit: Payer: Medicaid Other | Admitting: Student

## 2021-02-21 ENCOUNTER — Other Ambulatory Visit: Payer: Self-pay

## 2021-02-21 ENCOUNTER — Other Ambulatory Visit: Payer: Self-pay | Admitting: Obstetrics & Gynecology

## 2021-02-21 ENCOUNTER — Ambulatory Visit (INDEPENDENT_AMBULATORY_CARE_PROVIDER_SITE_OTHER): Payer: Medicaid Other | Admitting: Obstetrics & Gynecology

## 2021-02-21 ENCOUNTER — Encounter: Payer: Self-pay | Admitting: Obstetrics & Gynecology

## 2021-02-21 DIAGNOSIS — I10 Essential (primary) hypertension: Secondary | ICD-10-CM

## 2021-02-21 DIAGNOSIS — Z975 Presence of (intrauterine) contraceptive device: Secondary | ICD-10-CM

## 2021-02-21 DIAGNOSIS — Z30431 Encounter for routine checking of intrauterine contraceptive device: Secondary | ICD-10-CM

## 2021-02-21 DIAGNOSIS — K649 Unspecified hemorrhoids: Secondary | ICD-10-CM | POA: Diagnosis not present

## 2021-02-21 DIAGNOSIS — F339 Major depressive disorder, recurrent, unspecified: Secondary | ICD-10-CM | POA: Diagnosis not present

## 2021-02-21 MED ORDER — AMLODIPINE BESYLATE 5 MG PO TABS: 1.0000 | ORAL_TABLET | Freq: Every day | ORAL | 3 refills | Status: DC

## 2021-02-21 MED ORDER — ESCITALOPRAM OXALATE 10 MG PO TABS: 10.0000 mg | ORAL_TABLET | Freq: Every day | ORAL | 3 refills | Status: DC

## 2021-02-21 MED ORDER — HYDROCORTISONE (PERIANAL) 2.5 % EX CREA: TOPICAL_CREAM | Freq: Two times a day (BID) | CUTANEOUS | 2 refills | Status: DC

## 2021-02-21 NOTE — Progress Notes (Signed)
Post Partum Visit Note  Laura Vaughan is a 30 y.o. [phone] female who presents for a postpartum visit. She is 6 weeks postpartum following a normal spontaneous vaginal delivery.  I have fully reviewed the prenatal and intrapartum course; has chronic hypertension. The delivery was at 39.1 gestational weeks.  Had immediate postplacental Liletta placed. Anesthesia: epidural. Postpartum course has been uncomplicated. Baby is doing well. Baby is feeding by bottle - [employer] Good Start. Bleeding staining only. Bowel function is normal. Bladder function is normal. Patient is not sexually active. Contraception method is IUD. Postpartum depression screening: negative.  Patient reports hemorrhoids, wants medication to help. Also desires refill of Lexapro and Norvasc.    Edinburgh Postnatal Depression Scale - 02/21/21 1538      Edinburgh Postnatal Depression Scale:  In the Past 7 Days   I have been able to laugh and see the funny side of things. 0    I have looked forward with enjoyment to things. 0    I have blamed myself unnecessarily when things went wrong. 2    I have been anxious or worried for no good reason. 2    I have felt scared or panicky for no good reason. 2    Things have been getting on top of me. 1    I have been so unhappy that I have had difficulty sleeping. 1    I have felt sad or miserable. 1    I have been so unhappy that I have been crying. 1    The thought of harming myself has occurred to me. 0    Edinburgh Postnatal Depression Scale Total 10           Health Maintenance Due  Topic Date Due  . COVID-19 Vaccine (1) Never done    The following portions of the patient's history were reviewed and updated as appropriate: allergies, current medications, past family history, past medical history, past social history, past surgical history and problem list.  Review of Systems Pertinent items noted in HPI and remainder of comprehensive ROS otherwise negative.  Objective:   BP (!) 135/93   Pulse (!) 101   Wt 197 lb (89.4 kg)   BMI 29.95 kg/m    General:  alert and no distress   Breasts:  not indicated  Lungs: clear to auscultation bilaterally  Heart:  regular rate and rhythm  Abdomen: soft, non-tender; bowel sounds normal; no masses,  no organomegaly   GU exam:  normal. Well healed periclitoral laceration. Liletta IUD strings visualized about 6 cm in length, cut to 3 cm.         Assessment:  1. Essential hypertension - amLODipine (NORVASC) 5 MG tablet; TAKE 1 TABLET (5 MG TOTAL) BY MOUTH DAILY.  Dispense: 30 tablet; Refill: 3  2. Depression, recurrent (HCC) - escitalopram (LEXAPRO) 10 MG tablet; Take 1 tablet (10 mg total) by mouth daily.  Dispense: 30 tablet; Refill: 3  3. Hemorrhoids, unspecified hemorrhoid type - hydrocortisone (ANUSOL-HC) 2.5 % rectal cream; Place rectally 2 (two) times daily.  Dispense: 30 g; Refill: 2  4. IUD (intrauterine device) in place 5. IUD check up 6. Postpartum care following vaginal delivery  Plan:   Essential components of care per ACOG recommendations:  1.  Mood and well being: Patient with positive depression screening today. Reviewed local resources for support.  Declines [facility] clinician for now, she knows this is always available. Lexapro 10 mg refilled.  - Patient tobacco use? No.   -  hx of drug use? No.    2. Infant care and feeding:  -Patient currently breastmilk feeding? No.  -Social determinants of health (SDOH) reviewed in EPIC. No concerns  3. Sexuality, contraception and birth spacing - Patient does not want a pregnancy in the next year.  Desired family size is 5 children.  - Has Liletta IUD in place, reassuring string check today. Can be in place for up to seven years. - Discussed birth spacing of 18 months  4. Sleep and fatigue -Encouraged family/partner/community support of 4 hrs of uninterrupted sleep to help with mood and fatigue  5. Physical Recovery  - Discussed patients delivery and  complications. She describes her labor as mixed. - Patient had a Vaginal, no problems at delivery. Patient had a periclitoral laceration. Perineal healing reviewed. Patient expressed understanding - Patient has urinary incontinence? No. - Patient is safe to resume physical and sexual activity  6. Hemorrhoids - Anusol prescribed  7. Chronic Disease/Pregnancy Condition follow up: Hypertension  - Norvasc refilled - PCP follow up  8.  Health Maintenance - HM due items addressed Yes - Last pap smear done at [facility] 05/30/2020 - normal  Jaynie Collins, MD Center for [facility], [facility]

## 2021-05-30 ENCOUNTER — Ambulatory Visit: Payer: Medicaid Other | Admitting: Family Medicine

## 2021-07-24 ENCOUNTER — Encounter (HOSPITAL_COMMUNITY): Payer: Self-pay

## 2021-07-24 ENCOUNTER — Emergency Department (HOSPITAL_COMMUNITY): Payer: Medicaid Other

## 2021-07-24 ENCOUNTER — Other Ambulatory Visit: Payer: Self-pay | Admitting: Family Medicine

## 2021-07-24 ENCOUNTER — Other Ambulatory Visit: Payer: Self-pay

## 2021-07-24 ENCOUNTER — Emergency Department (HOSPITAL_COMMUNITY)
Admission: EM | Admit: 2021-07-24 | Discharge: 2021-07-24 | Disposition: A | Discharge status: Home / Self Care | Payer: Medicaid Other | Attending: Emergency Medicine | Admitting: Emergency Medicine

## 2021-07-24 ENCOUNTER — Telehealth: Payer: Self-pay

## 2021-07-24 ENCOUNTER — Telehealth (INDEPENDENT_AMBULATORY_CARE_PROVIDER_SITE_OTHER): Payer: Medicaid Other | Admitting: Family Medicine

## 2021-07-24 VITALS — Wt 185.0 lb

## 2021-07-24 DIAGNOSIS — Z8616 Personal history of COVID-19: Secondary | ICD-10-CM | POA: Diagnosis not present

## 2021-07-24 DIAGNOSIS — B974 Respiratory syncytial virus as the cause of diseases classified elsewhere: Secondary | ICD-10-CM | POA: Diagnosis not present

## 2021-07-24 DIAGNOSIS — R079 Chest pain, unspecified: Secondary | ICD-10-CM | POA: Insufficient documentation

## 2021-07-24 DIAGNOSIS — I10 Essential (primary) hypertension: Secondary | ICD-10-CM | POA: Insufficient documentation

## 2021-07-24 DIAGNOSIS — J45909 Unspecified asthma, uncomplicated: Secondary | ICD-10-CM | POA: Diagnosis not present

## 2021-07-24 DIAGNOSIS — R059 Cough, unspecified: Secondary | ICD-10-CM | POA: Insufficient documentation

## 2021-07-24 DIAGNOSIS — Z20822 Contact with and (suspected) exposure to covid-19: Secondary | ICD-10-CM

## 2021-07-24 DIAGNOSIS — Z79899 Other long term (current) drug therapy: Secondary | ICD-10-CM | POA: Insufficient documentation

## 2021-07-24 DIAGNOSIS — R000 Tachycardia, unspecified: Secondary | ICD-10-CM | POA: Insufficient documentation

## 2021-07-24 DIAGNOSIS — B338 Other specified viral diseases: Secondary | ICD-10-CM

## 2021-07-24 MED ORDER — BENZONATATE 100 MG PO CAPS: 100.0000 mg | ORAL_CAPSULE | Freq: Three times a day (TID) | ORAL | 0 refills | Status: DC

## 2021-07-24 MED ORDER — ALBUTEROL SULFATE HFA 108 (90 BASE) MCG/ACT IN AERS: 2.0000 | INHALATION_SPRAY | RESPIRATORY_TRACT | 3 refills | Status: DC | PRN

## 2021-07-24 MED ORDER — NAPROXEN 500 MG PO TABS: 500.0000 mg | ORAL_TABLET | Freq: Two times a day (BID) | ORAL | 0 refills | Status: DC

## 2021-07-24 NOTE — ED Provider Notes (Signed)
[facility] EMERGENCY DEPARTMENT Provider Note   CSN: [id number] Arrival date & time: 07/24/21  2135     History Chief Complaint  Patient presents with   Chest Pain    Laura Vaughan is a 30 y.o. female.   Chest Pain Associated symptoms: cough   Associated symptoms: no fever    This patient is a 30 year old female, she has a history of asthma, she has previously had COVID-19.  Her child had RSV last week and she was in the hospital with the child, for the last 3 days the patient has had some nonproductive coughing with some hoarseness, runny nose and congestion.  Has a headache and mild fatigue.  Denies nausea vomiting or diarrhea, no medications prior to arrival, symptoms are persistent, nothing seems to make it better or worse  Past Medical History:  Diagnosis Date   Asthma    Childhood asthma 10/02/2020   COVID-19 affecting pregnancy in third trimester 10/17/2020   Dx 1/8   Depression    Depression, recurrent (HCC) 10/02/2020   Hypertension     Patient Active Problem List   Diagnosis Date Noted   Hypertension 07/24/2021   Chest pain 07/24/2021   IUD (intrauterine device) in place 01/07/2021    Past Surgical History:  Procedure Laterality Date   EYE SURGERY     cateracts     OB History     Gravida  7   Para  3   Term  3   Preterm      AB  4   Living  3      SAB  4   IAB      Ectopic      Multiple  0   Live Births  3        Obstetric Comments  Had Preeclampsia in 2014-- not on magnesium per her recall         Family History  Problem Relation Age of Onset   Depression Mother    Miscarriages / Stillbirths Mother    Depression Father    Hypertension Father    Stroke Father    Aneurysm Father    Drug abuse Brother    Asthma Brother    Heart disease Maternal Grandmother    Hypertension Maternal Grandmother    Early death Maternal Grandfather    Drug abuse Maternal Grandfather     Social History   Tobacco Use   Smoking  status: Never   Smokeless tobacco: Never  Vaping Use   Vaping Use: Never used  Substance Use Topics   Alcohol use: Not Currently   Drug use: Never    Home Medications Prior to Admission medications   Medication Sig Start Date End Date Taking? Authorizing Provider  albuterol (VENTOLIN HFA) 108 (90 Base) MCG/ACT inhaler Inhale 2 puffs into the lungs every 4 (four) hours as needed for wheezing or shortness of breath. 07/24/21  Yes Eber Hong, MD  benzonatate (TESSALON) 100 MG capsule Take 1 capsule (100 mg total) by mouth every 8 (eight) hours. 07/24/21  Yes Eber Hong, MD  naproxen (NAPROSYN) 500 MG tablet Take 1 tablet (500 mg total) by mouth 2 (two) times daily with a meal. 07/24/21  Yes Eber Hong, MD  acetaminophen (TYLENOL) 500 MG tablet Take 2 tablets (1,000 mg total) by mouth every 6 (six) hours as needed (for pain scale < 4). 01/03/21   Alric Seton, MD  amLODipine (NORVASC) 5 MG tablet TAKE 1 TABLET (5 MG TOTAL) BY MOUTH  DAILY. 02/21/21 02/21/22  Anyanwu, Jethro Bastos, MD  coconut oil OIL Apply 1 application topically as needed. 01/03/21   Alric Seton, MD  escitalopram (LEXAPRO) 10 MG tablet Take 1 tablet (10 mg total) by mouth daily. 02/21/21   Anyanwu, Jethro Bastos, MD  hydrocortisone (ANUSOL-HC) 2.5 % rectal cream Place rectally 2 (two) times daily. 02/21/21   Anyanwu, Jethro Bastos, MD  ibuprofen (ADVIL) 600 MG tablet Take 1 tablet (600 mg total) by mouth every 6 (six) hours as needed. 01/03/21   Alric Seton, MD  Prenatal Vit-Fe Fumarate-FA (PRENATAL MULTIVITAMIN) TABS tablet Take 1 tablet by mouth daily at 12 noon.    [provider]    Allergies    Patient has no known allergies.  Review of Systems   Review of Systems  Constitutional:  Negative for fever.  HENT:  Positive for congestion and rhinorrhea.   Respiratory:  Positive for cough.   Cardiovascular:  Positive for chest pain.   Physical Exam Updated Vital Signs BP (!) 150/102 (BP Location:  Right Arm)   Pulse (!) 111   Temp 98.5 F (36.9 C) (Oral)   Resp 17   Ht 1.727 m (5\' 8" )   Wt 83.9 kg   LMP 07/08/2021 (Exact Date)   SpO2 93%   BMI 28.13 kg/m   Physical Exam Vitals and nursing note reviewed.  Constitutional:      Appearance: She is well-developed. She is not diaphoretic.  HENT:     Head: Normocephalic and atraumatic.     Nose: Nose normal. No congestion or rhinorrhea.     Mouth/Throat:     Mouth: Mucous membranes are moist.     Pharynx: Oropharynx is clear. No oropharyngeal exudate or posterior oropharyngeal erythema.  Eyes:     General:        Right eye: No discharge.        Left eye: No discharge.     Extraocular Movements: Extraocular movements intact.     Conjunctiva/sclera: Conjunctivae normal.     Pupils: Pupils are equal, round, and reactive to light.  Pulmonary:     Effort: Pulmonary effort is normal. No respiratory distress.  Musculoskeletal:     Cervical back: Normal range of motion.  Lymphadenopathy:     Cervical: No cervical adenopathy.  Skin:    General: Skin is warm and dry.     Findings: No erythema or rash.  Neurological:     Mental Status: She is alert.     Coordination: Coordination normal.    ED Results / Procedures / Treatments   Labs (all labs ordered are listed, but only abnormal results are displayed) Labs Reviewed - No data to display  EKG EKG Interpretation  Date/Time:  Wednesday July 24 2021 21:58:38 EDT Ventricular Rate:  96 PR Interval:  167 QRS Duration: 91 QT Interval:  360 QTC Calculation: 455 R Axis:   89 Text Interpretation: Sinus rhythm RSR' in V1 or V2, right VCD or RVH normal EKG Confirmed by [id number] (Eber Hong) on 07/24/2021 10:19:23 PM  Radiology No results found.  Procedures Procedures   Medications Ordered in ED Medications - No data to display  ED Course  I have reviewed the triage vital signs and the nursing notes.  Pertinent labs & imaging results that were available during my  care of the patient were reviewed by me and considered in my medical decision making (see chart for details).    MDM Rules/Calculators/A&P  EKG with mild tachycardia otherwise the patient appears well, she has no other symptoms at this time, she has a hoarse voice but has had an RSV exposure.  She has no signs of peritonsillar abscess or other severe infection of the head or neck.  This appears to be a viral URI, no indication for imaging, home with Tessalon Ventolin and Naprosyn.  Final Clinical Impression(s) / ED Diagnoses Final diagnoses:  RSV infection    Rx / DC Orders ED Discharge Orders          Ordered    benzonatate (TESSALON) 100 MG capsule  Every 8 hours        07/24/21 2226    albuterol (VENTOLIN HFA) 108 (90 Base) MCG/ACT inhaler  Every 4 hours PRN        07/24/21 2226    naproxen (NAPROSYN) 500 MG tablet  2 times daily with meals        07/24/21 2226             Eber Hong, MD 07/24/21 2228

## 2021-07-24 NOTE — Assessment & Plan Note (Signed)
In setting of URI and w/ history of HTN. Well appearing - however, describes intermittent CP associated with SOB which resolves within 15 minutes. 30 yo female so less likely cardiac, but given given virtual visit and possible Covid - advised ER/UC immediately. She is considering. Discussed if worsening or not improving would strongly recommend ER.

## 2021-07-24 NOTE — Progress Notes (Signed)
I connected with Laura Vaughan on 07/24/21 at 11:20 AM EDT by video and verified that I am speaking with the correct person using two identifiers.   I discussed the limitations, risks, security and privacy concerns of performing an evaluation and management service by video and the availability of in person appointments. I also discussed with the patient that there may be a patient responsible charge related to this service. The patient expressed understanding and agreed to proceed.  Patient location: Home Provider Location: [facility] Participants: Lynnda Child and Morrie Sheldon Buehrer   Subjective:     Laura Vaughan is a 30 y.o. female presenting for Nasal Congestion (All symptoms started Sunday. No covid test done), Cough, Sore Throat, and Nausea     Cough Associated symptoms include a sore throat. Pertinent negatives include no chills or fever.  Sore Throat  Associated symptoms include congestion and coughing.   #Congestion  - 07/21/2021 - cough, congestion, sore throat - nausea - body aches the first 1-2 days, these have improved - no fever - treatment - nothing - no otc medication or tylenol or ibuprofen - loss of voice - some loss of taste/smell - has not taken a covid test - nausea - is not eating very much  Is having some chest pain - this will come on and radiate down the right arm - will have high blood pressure  - will last a few minutes to 15 minutes - occur at rests or with light walking - will also have shortness - has not    Review of Systems  Constitutional:  Negative for chills and fever.  HENT:  Positive for congestion, sore throat and voice change.   Respiratory:  Positive for cough.     Social History   Tobacco Use  Smoking Status Never  Smokeless Tobacco Never        Objective:   BP Readings from Last 3 Encounters:  02/21/21 (!) 135/93  01/10/21 130/81  01/03/21 126/81   Wt Readings from Last 3 Encounters:  07/24/21 185 lb  (83.9 kg)  02/21/21 197 lb (89.4 kg)  01/02/21 217 lb 3 oz (98.5 kg)    Wt 185 lb (83.9 kg)   LMP 07/15/2021 (Exact Date)   Breastfeeding No   BMI 28.13 kg/m   Physical Exam Constitutional:      Appearance: Normal appearance. She is not ill-appearing.  HENT:     Head: Normocephalic and atraumatic.     Right Ear: External ear normal.     Left Ear: External ear normal.  Eyes:     Conjunctiva/sclera: Conjunctivae normal.  Pulmonary:     Effort: Pulmonary effort is normal. No respiratory distress.  Neurological:     Mental Status: She is alert. Mental status is at baseline.  Psychiatric:        Mood and Affect: Mood normal.        Behavior: Behavior normal.        Thought Content: Thought content normal.        Judgment: Judgment normal.          Assessment & Plan:   Problem List Items Addressed This Visit       Cardiovascular and Mediastinum   Hypertension     Other   Chest pain    In setting of URI and w/ history of HTN. Well appearing - however, describes intermittent CP associated with SOB which resolves within 15 minutes. 30 yo female so less likely cardiac,  but given given virtual visit and possible Covid - advised ER/UC immediately. She is considering. Discussed if worsening or not improving would strongly recommend ER.       Other Visit Diagnoses     Suspected COVID-19 virus infection    -  Primary      Discussed OTC treatment for viral illness Patient will come today for drive-up testing Instructed to isolate until the results come back   Pt is high risk for severe covid due to obesity and HTN. Discussed paxlovid as treatment if positive she will consider.   Liver and kidney function is normal. No drug interactions. She will consider treatment.   If negative and symptoms persisting will call back for consideration for course of antibiotics   See plan for CP - pt was advised to get in-person evaluation at [facility] or ER. If refusing to go will still  plan for drive up testing to get more informaion   Return if symptoms worsen or fail to improve.  Lynnda Child, MD

## 2021-07-24 NOTE — Patient Instructions (Addendum)
Isolate as though you have Covid until we get the results  If covid positive - will need to isolate until 07/27/2021 (can wear a mask on this day)  Can end isolation 08/01/2021  If positive you are eligible for Paxlovid the medication used to treat Covid 19    Based on your symptoms, it looks like you have a virus.   Antibiotics are not need for a viral infection but the following will help:   Drink plenty of fluids Get lots of rest  Sinus Congestion 1) Neti Pot (Saline rinse) -- 2 times day -- if tolerated 2) Flonase (Store Brand ok) - once daily 3) Over the counter congestion medications  Cough 1) Cough drops can be helpful 2) Nyquil (or nighttime cough medication) 3) Honey is proven to be one of the best cough medications  4) Cough medicine with Dextromethorphan can also be helpful  Sore Throat 1) Honey as above, cough drops 2) Ibuprofen or Aleve can be helpful 3) Salt water Gargles

## 2021-07-24 NOTE — Telephone Encounter (Signed)
Agree with ER given persistent pain.

## 2021-07-24 NOTE — Discharge Instructions (Signed)
Please take Tessalon 100mg  by mouth 3 times daily for cough as needed.  Please take 2 puffs of albuterol every 4 hours for the first 24 hours, then every 4 hours as needed.  This medicine will help to open up your lungs and allow you breathe easier, cough less and have less tightness in your chest.  This medicine can be taken with you - it is small and portable.    Please take Naprosyn, 500mg  by mouth twice daily as needed for pain - this in an antiinflammatory medicine (NSAID) and is similar to ibuprofen - many people feel that it is stronger than ibuprofen and it is easier to take since it is a smaller pill.  Please use this only for 1 week - if your pain persists, you will need to follow up with your doctor in the office for ongoing guidance and pain control.    Thank you for letting take care of you today!  Please obtain all of your results from medical records or have your doctors office obtain the results - share them with your doctor - you should be seen at your doctors office in the next 2 days. Call today to arrange your follow up. Take the medications as prescribed. Please review all of the medicines and only take them if you do not have an allergy to them. Please be aware that if you are taking birth control pills, taking other prescriptions, ESPECIALLY ANTIBIOTICS may make the birth control ineffective - if this is the case, either do not engage in sexual activity or use alternative methods of birth control such as condoms until you have finished the medicine and your family doctor says it is OK to restart them. If you are on a blood thinner such as COUMADIN, be aware that any other medicine that you take may cause the coumadin to either work too much, or not enough - you should have your coumadin level rechecked in next 7 days if this is the case.  ?  It is also a possibility that you have an allergic reaction to any of the medicines that you have been prescribed - Everybody reacts  differently to medications and while MOST people have no trouble with most medicines, you may have a reaction such as nausea, vomiting, rash, swelling, shortness of breath. If this is the case, please stop taking the medicine immediately and contact your physician.   If you were given a medication in the ED such as percocet, vicodin, or morphine, be aware that these medicines are sedating and may change your ability to take care of yourself adequately for several hours after being given this medicines - you should not drive or take care of small children if you were given this medicine in the Emergency Department or if you have been prescribed these types of medicines. ?   You should return to the ER IMMEDIATELY if you develop severe or worsening symptoms.

## 2021-07-24 NOTE — Telephone Encounter (Signed)
Pt calling and having the same CP that pt had when had video visit with Dr Selena Batten earlier today. Pt said still has throbbing pain in chest with pain level of 5-6 now. Pt said the pain still radiates down lt arm but new symptom is pt is having CP radiate into lt neck.pt is having SOB with the CP and when ask pt how long episodes of CP is lasting now pt is not sure ;pt said maybe 5 mins or more. No wheezing. Per video visit Dr Selena Batten advised pt to go to UC/ED immediately; pt did not go. Pt is at doctors office with her 3 kids now who are also sick. Advised pt could go to [facility] or ED in [place] and pt said she was not taking her kids to [facility] or ED. Pt said her mom could watch kids when she gets off work in a little bit. Pt said she will go to [facility] ED in [place]. UC & ED precautions given again and pt voiced understanding. Sending note to Dr Selena Batten who did video visit earlier today, [facility] pool since Manning Charity NP listed as PCP and [place] CMA.

## 2021-07-24 NOTE — ED Triage Notes (Signed)
Pt complaining of chest pain that started around Monday. Pt states that it is left sided and radiates down left pain. Pt states that it is intermittent. Pt also endorses sore throat and headache.

## 2021-08-07 ENCOUNTER — Encounter: Payer: Medicaid Other | Admitting: Nurse Practitioner

## 2021-08-21 IMAGING — US US MFM OB FOLLOW-UP
1 series · 13 of 28 positions shown · non-contrast
Comparison: none

[Series 1: us mfm ob follow-up · 37 acquisitions, 13 frames shown]
[im 2/37]
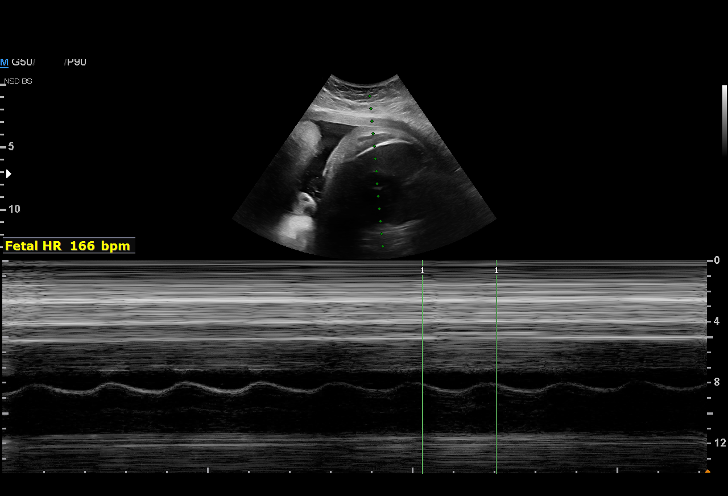
[im 5/37]
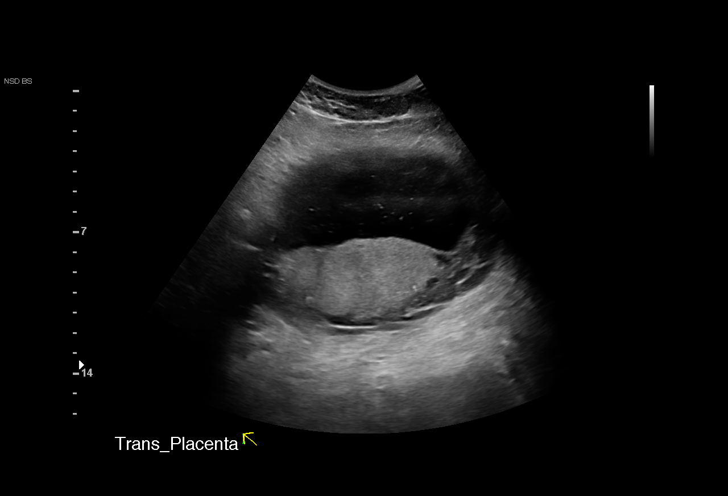
[im 7/37]
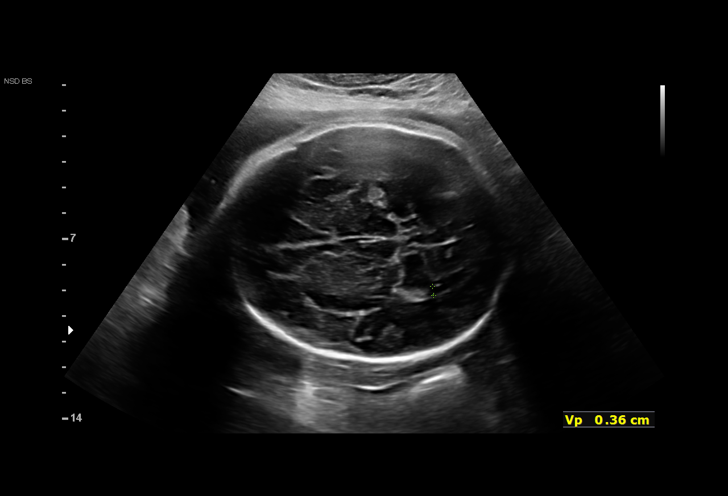
[im 10/37]
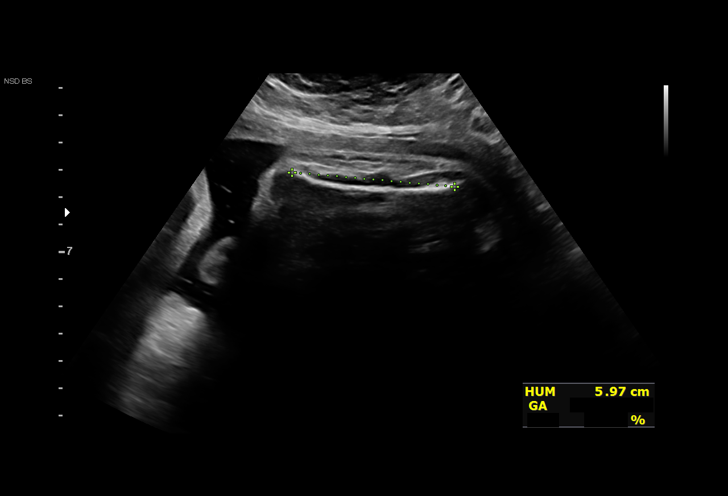
[im 13/37]
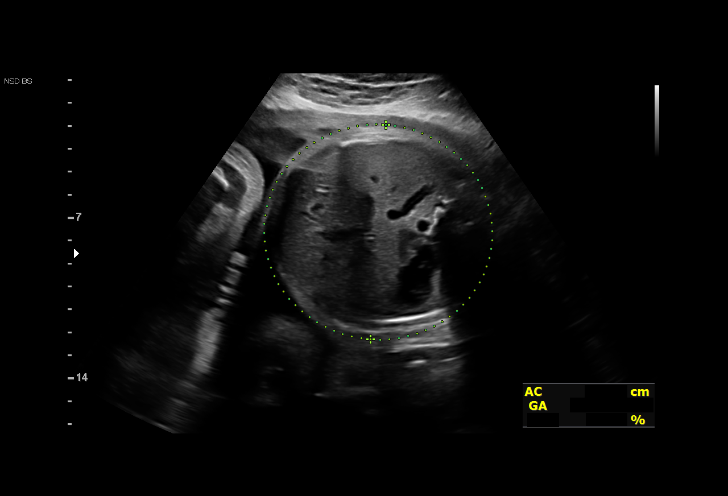
[im 15/37]
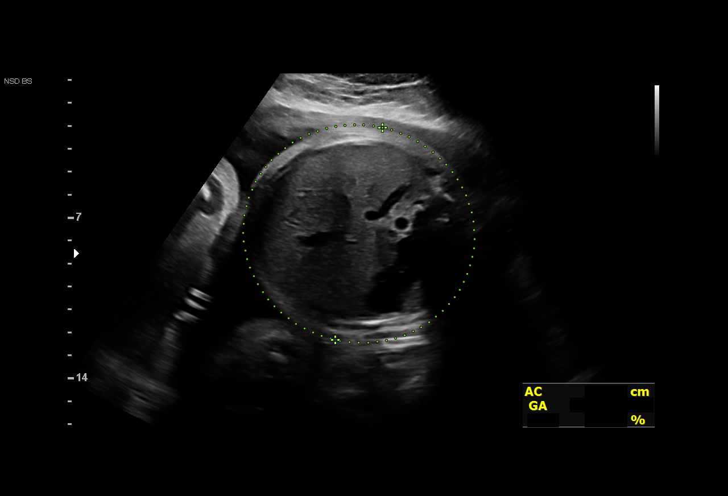
[im 19/37]
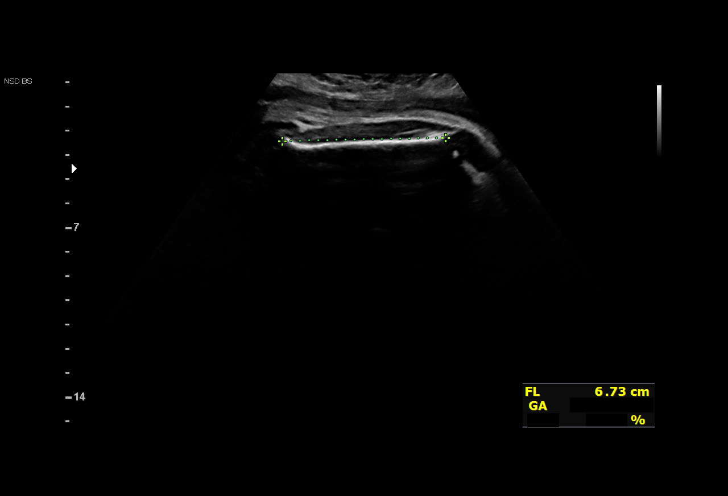
[im 22/37]
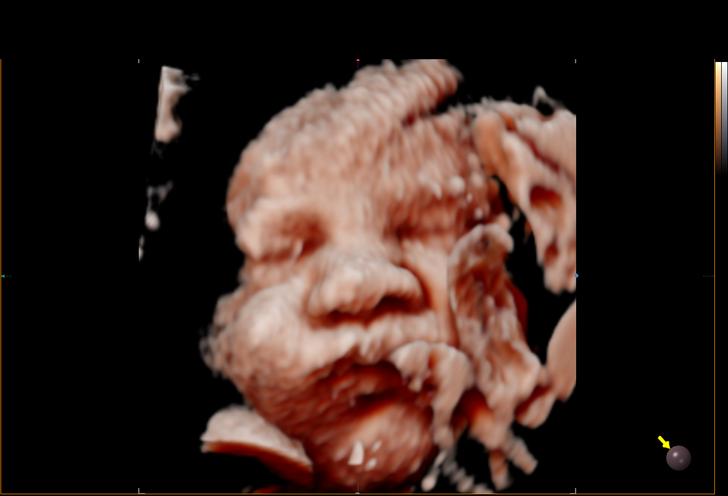
[im 25/37]
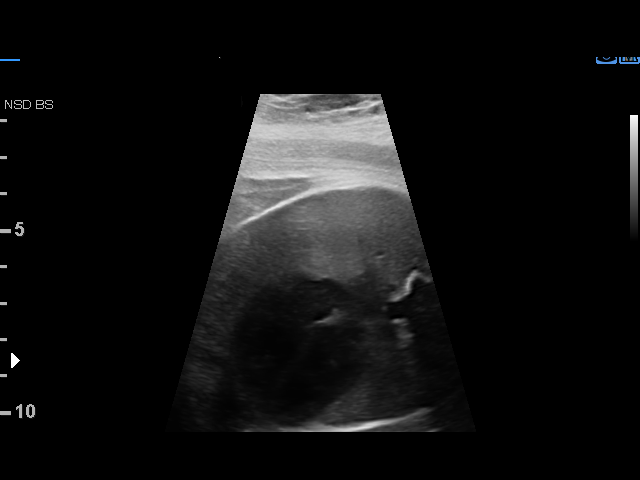
[im 27/37]
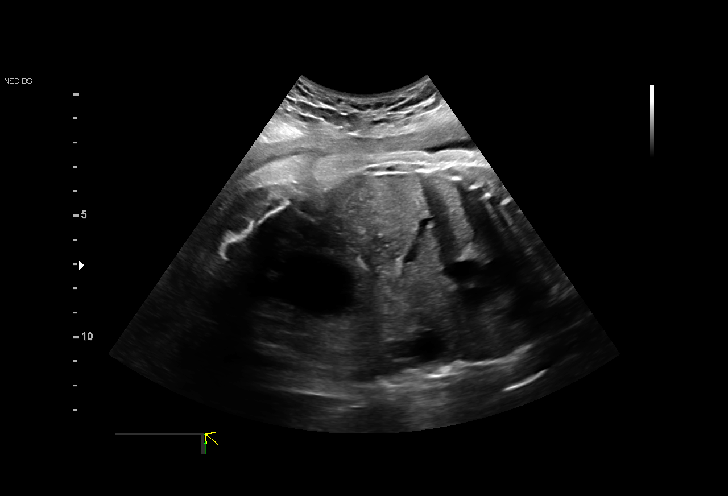
[im 30/37]
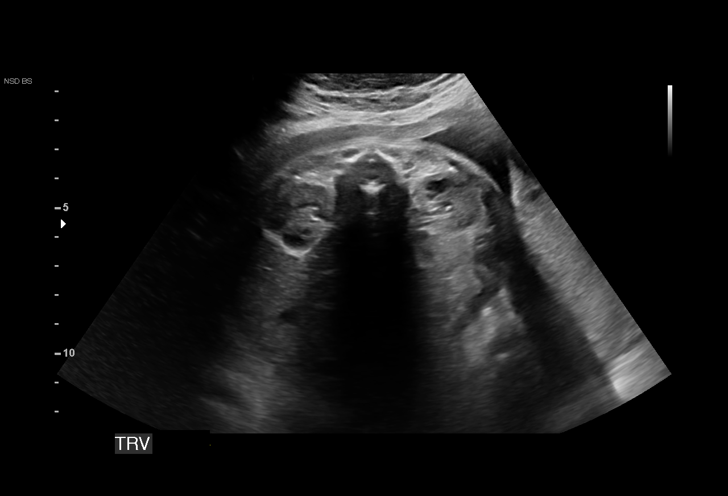
[im 33/37]
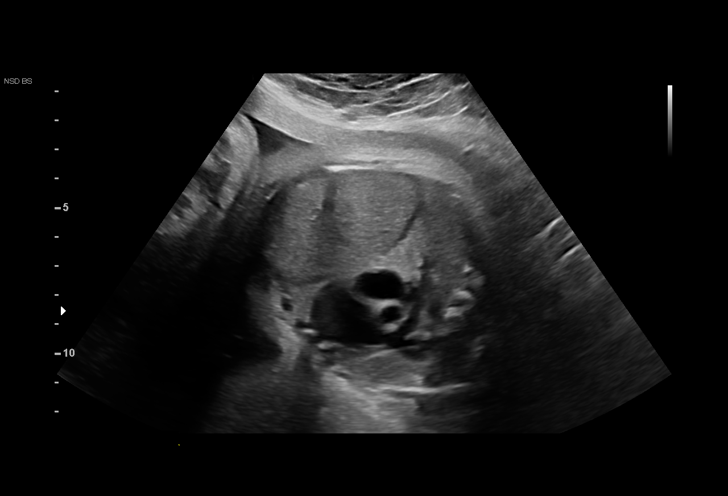
[im 35/37]
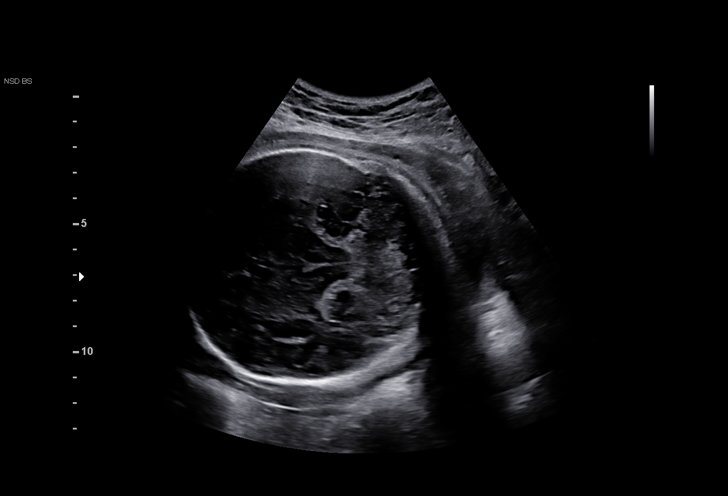

[13 of 28 positions shown; findings below may reference images not displayed]

Indications

 Hypertension - Chronic/Pre-existing
 36 weeks gestation of pregnancy
 Medical complication of pregnancy (Covid +
 10-13-20 @ 27 weeks 4 days)
 Poor obstetric history-Recurrent (habitual)
 abortion (3 consecutive ab's)
 Encounter for other antenatal screening
 follow-up
 LR ZatRM - per pt.
Fetal Evaluation

 Num Of Fetuses:         1
 Fetal Heart Rate(bpm):  166
 Cardiac Activity:       Observed
 Presentation:           Cephalic
 Placenta:               Posterior Fundal
 P. Cord Insertion:      Previously Visualized

 Amniotic Fluid
 AFI FV:      Within normal limits

 AFI Sum(cm)     %Tile       Largest Pocket(cm)
 19.35           73

 RUQ(cm)       RLQ(cm)       LUQ(cm)        LLQ(cm)

Biometry
 BPD:      91.5  mm     G. Age:  37w 1d         83  %    CI:        80.82   %    70 - 86
                                                         FL/HC:      20.8   %    20.1 -
 HC:      321.4  mm     G. Age:  36w 2d         23  %    HC/AC:      1.05        0.93 -
 AC:      304.8  mm     G. Age:  34w 3d         15  %    FL/BPD:     73.1   %    71 - 87
 FL:       66.9  mm     G. Age:  34w 3d         10  %    FL/AC:      21.9   %    20 - 24
 HUM:      60.1  mm     G. Age:  35w 0d         40  %
 LV:        3.6  mm

 Est. FW:    1135  gm    5 lb 10 oz      20  %
OB History

 Gravidity:    7         Term:   2         SAB:   4
 Living:       2
Gestational Age

 LMP:           36w 1d        Date:  04/03/20                 EDD:   01/08/21
 U/S Today:     35w 4d                                        EDD:   01/12/21
 Best:          36w 1d     Det. By:  LMP  (04/03/20)          EDD:   01/08/21
Anatomy

 Cranium:               Appears normal         LVOT:                   Previously seen
 Cavum:                 Previously seen        Aortic Arch:            Previously seen
 Ventricles:            Appears normal         Ductal Arch:            Previously seen
 Choroid Plexus:        Previously seen        Diaphragm:              Appears normal
 Cerebellum:            Previously seen        Stomach:                Appears normal, left
                                                                       sided
 Posterior Fossa:       Previously seen        Abdomen:                Appears normal
 Nuchal Fold:           Not applicable (>20    Abdominal Wall:         Previously seen
                        wks GA)
 Face:                  Orbits and profile     Cord Vessels:           Previously seen
                        previously seen
 Lips:                  Previously seen        Kidneys:                Appear normal
 Palate:                Previously seen        Bladder:                Appears normal
 Thoracic:              Appears normal         Spine:                  Previously seen
 Heart:                 Appears normal         Upper Extremities:      Previously seen
                        (4CH, axis, and
                        situs)
 RVOT:                  Appears normal         Lower Extremities:      Previously seen

 Other:  Male gender previously seen. Nasal bone, Heels/feet, open hands/5th
         digits previously visualized. VC, 3VV and 3VTV previously visualized.
Cervix Uterus Adnexa

 Cervix
 Not visualized (advanced GA >82wks)

 Right Ovary
 Previously seen

 Left Ovary
 Previously seen.
Comments

 This patient was seen for a follow up growth scan due to
 chronic hypertension that is not treated with any medications
 and as the overall EFW measured in the lower normal range
 based on her last exam.  She denies any problems since her
 last exam.
 She was informed that the fetal growth and amniotic fluid
 level appears appropriate for her gestational age.
 As the fetal growth is within normal limits, no further exams
 were scheduled in our office.

## 2021-09-09 ENCOUNTER — Ambulatory Visit
Admission: RE | Admit: 2021-09-09 | Discharge: 2021-09-09 | Disposition: A | Discharge status: Home / Self Care | Payer: Medicaid Other | Source: Ambulatory Visit | Attending: Physician Assistant | Admitting: Physician Assistant

## 2021-09-09 ENCOUNTER — Other Ambulatory Visit: Payer: Self-pay

## 2021-09-09 VITALS — BP 136/87 | HR 91 | Temp 97.9°F | Resp 16

## 2021-09-09 DIAGNOSIS — R1110 Vomiting, unspecified: Secondary | ICD-10-CM | POA: Diagnosis not present

## 2021-09-09 DIAGNOSIS — B349 Viral infection, unspecified: Secondary | ICD-10-CM

## 2021-09-09 NOTE — Discharge Instructions (Signed)
Tylenol every 4 hours  

## 2021-09-09 NOTE — ED Triage Notes (Signed)
Pt presents with emesis, HA, and fever that began yesterday. Pt has flu and GI bug exposure

## 2021-09-09 NOTE — ED Provider Notes (Signed)
[facility]    CSN: [id number] Arrival date & time: 09/09/21  1426      History   Chief Complaint Chief Complaint  Patient presents with   Emesis   Fever    HPI Laura Vaughan is a 30 y.o. female.   Pt has a child who started vomiting yesterday.  Pt complains of a headache and vomiting today   The history is provided by the patient. No language interpreter was used.  Emesis Severity:  Moderate Duration:  1 day Timing:  Intermittent Number of daily episodes:  2 Able to tolerate:  Liquids Progression:  Worsening Ineffective treatments:  None tried Associated symptoms: fever   Fever Associated symptoms: vomiting    Past Medical History:  Diagnosis Date   Asthma    Childhood asthma 10/02/2020   COVID-19 affecting pregnancy in third trimester 10/17/2020   Dx 1/8   Depression    Depression, recurrent (HCC) 10/02/2020   Hypertension     Patient Active Problem List   Diagnosis Date Noted   Hypertension 07/24/2021   Chest pain 07/24/2021   IUD (intrauterine device) in place 01/07/2021    Past Surgical History:  Procedure Laterality Date   EYE SURGERY     cateracts    OB History     Gravida  7   Para  3   Term  3   Preterm      AB  4   Living  3      SAB  4   IAB      Ectopic      Multiple  0   Live Births  3        Obstetric Comments  Had Preeclampsia in 2014-- not on magnesium per her recall          Home Medications    Prior to Admission medications   Medication Sig Start Date End Date Taking? Authorizing Provider  acetaminophen (TYLENOL) 500 MG tablet Take 2 tablets (1,000 mg total) by mouth every 6 (six) hours as needed (for pain scale < 4). 01/03/21   Alric Seton, MD  albuterol (VENTOLIN HFA) 108 (90 Base) MCG/ACT inhaler Inhale 2 puffs into the lungs every 4 (four) hours as needed for wheezing or shortness of breath. Patient not taking: Reported on 09/09/2021 07/24/21   Eber Hong, MD  amLODipine  (NORVASC) 5 MG tablet TAKE 1 TABLET (5 MG TOTAL) BY MOUTH DAILY. 02/21/21 02/21/22  Anyanwu, Jethro Bastos, MD  benzonatate (TESSALON) 100 MG capsule Take 1 capsule (100 mg total) by mouth every 8 (eight) hours. Patient not taking: Reported on 09/09/2021 07/24/21   Eber Hong, MD  coconut oil OIL Apply 1 application topically as needed. 01/03/21   Alric Seton, MD  escitalopram (LEXAPRO) 10 MG tablet Take 1 tablet (10 mg total) by mouth daily. 02/21/21   Anyanwu, Jethro Bastos, MD  hydrocortisone (ANUSOL-HC) 2.5 % rectal cream Place rectally 2 (two) times daily. Patient not taking: Reported on 09/09/2021 02/21/21   Tereso Newcomer, MD  ibuprofen (ADVIL) 600 MG tablet Take 1 tablet (600 mg total) by mouth every 6 (six) hours as needed. 01/03/21   Alric Seton, MD  naproxen (NAPROSYN) 500 MG tablet Take 1 tablet (500 mg total) by mouth 2 (two) times daily with a meal. Patient not taking: Reported on 09/09/2021 07/24/21   Eber Hong, MD  Prenatal Vit-Fe Fumarate-FA (PRENATAL MULTIVITAMIN) TABS tablet Take 1 tablet by mouth daily at 12 noon. Patient not  taking: Reported on 09/09/2021    [provider]    Family History Family History  Problem Relation Age of Onset   Depression Mother    Miscarriages / Stillbirths Mother    Depression Father    Hypertension Father    Stroke Father    Aneurysm Father    Drug abuse Brother    Asthma Brother    Heart disease Maternal Grandmother    Hypertension Maternal Grandmother    Early death Maternal Grandfather    Drug abuse Maternal Grandfather     Social History Social History   Tobacco Use   Smoking status: Never   Smokeless tobacco: Never  Vaping Use   Vaping Use: Never used  Substance Use Topics   Alcohol use: Not Currently   Drug use: Never     Allergies   Patient has no known allergies.   Review of Systems Review of Systems  Constitutional:  Positive for fever.  Gastrointestinal:  Positive for vomiting.  All other  systems reviewed and are negative.   Physical Exam Triage Vital Signs ED Triage Vitals  Enc Vitals Group     BP 09/09/21 1527 136/87     Pulse Rate 09/09/21 1527 91     Resp 09/09/21 1527 16     Temp 09/09/21 1527 97.9 F (36.6 C)     Temp Source 09/09/21 1527 Oral     SpO2 09/09/21 1527 95 %     Weight --      Height --      Head Circumference --      Peak Flow --      Pain Score 09/09/21 1528 3     Pain Loc --      Pain Edu? --      Excl. in GC? --    No data found.  Updated Vital Signs BP 136/87 (BP Location: Right Arm)   Pulse 91   Temp 97.9 F (36.6 C) (Oral)   Resp 16   SpO2 95%   Visual Acuity Right Eye Distance:   Left Eye Distance:   Bilateral Distance:    Right Eye Near:   Left Eye Near:    Bilateral Near:     Physical Exam Vitals and nursing note reviewed.  Constitutional:      Appearance: She is well-developed.  HENT:     Head: Normocephalic.     Mouth/Throat:     Mouth: Mucous membranes are moist.  Eyes:     Pupils: Pupils are equal, round, and reactive to light.  Cardiovascular:     Rate and Rhythm: Normal rate.  Pulmonary:     Effort: Pulmonary effort is normal.  Abdominal:     General: There is no distension.  Musculoskeletal:        General: Normal range of motion.     Cervical back: Normal range of motion.  Neurological:     Mental Status: She is alert and oriented to person, place, and time.     UC Treatments / Results  Labs (all labs ordered are listed, but only abnormal results are displayed) Labs Reviewed  COVID-19, FLU A+B NAA    EKG   Radiology No results found.  Procedures Procedures (including critical care time)  Medications Ordered in UC Medications - No data to display  Initial Impression / Assessment and Plan / UC Course  I have reviewed the triage vital signs and the nursing notes.  Pertinent labs & imaging results that were available during  my care of the patient were reviewed by me and considered  in my medical decision making (see chart for details).     INfluenza and covid pending  Final Clinical Impressions(s) / UC Diagnoses   Final diagnoses:  Vomiting, unspecified vomiting type, unspecified whether nausea present  Viral illness     Discharge Instructions      Tylenol every 4 hours   ED Prescriptions   None    PDMP not reviewed this encounter. An After Visit Summary was printed and given to the patient.    Elson Areas, [place] 09/09/21 1704

## 2021-09-10 LAB — COVID-19, FLU A+B NAA
Influenza A, NAA: NOT DETECTED
Influenza B, NAA: NOT DETECTED
SARS-CoV-2, NAA: NOT DETECTED

## 2021-09-12 ENCOUNTER — Ambulatory Visit: Payer: Self-pay

## 2021-09-16 ENCOUNTER — Encounter: Payer: Self-pay | Admitting: Internal Medicine

## 2021-09-16 ENCOUNTER — Other Ambulatory Visit: Payer: Self-pay

## 2021-09-16 ENCOUNTER — Ambulatory Visit (INDEPENDENT_AMBULATORY_CARE_PROVIDER_SITE_OTHER): Payer: Medicaid Other | Admitting: Internal Medicine

## 2021-09-16 ENCOUNTER — Encounter: Payer: Medicaid Other | Admitting: Family

## 2021-09-16 VITALS — BP 119/94 | HR 92 | Temp 97.7°F | Resp 18 | Ht 68.0 in | Wt 203.0 lb

## 2021-09-16 DIAGNOSIS — B349 Viral infection, unspecified: Secondary | ICD-10-CM

## 2021-09-16 NOTE — Progress Notes (Signed)
Subjective:    Patient ID: Laura Vaughan, female    DOB: 09-12-1991, 30 y.o.   MRN: [id number]  HPI  Pt presents to the clinic today for urgent care follow up. She went to [facility] 12/5 with c/o vomiting. She tested negative for flu, covid and RSV. She was advised to treat her symptoms with OTC medication. Since that time, she  reports she is feeling much better.  She needs a not stating that she can return to work.   Review of Systems     Past Medical History:  Diagnosis Date   Asthma    Childhood asthma 10/02/2020   COVID-19 affecting pregnancy in third trimester 10/17/2020   Dx 1/8   Depression    Depression, recurrent (HCC) 10/02/2020   Hypertension     Current Outpatient Medications  Medication Sig Dispense Refill   acetaminophen (TYLENOL) 500 MG tablet Take 2 tablets (1,000 mg total) by mouth every 6 (six) hours as needed (for pain scale < 4).     albuterol (VENTOLIN HFA) 108 (90 Base) MCG/ACT inhaler Inhale 2 puffs into the lungs every 4 (four) hours as needed for wheezing or shortness of breath. (Patient not taking: Reported on 09/09/2021) 1 each 3   amLODipine (NORVASC) 5 MG tablet TAKE 1 TABLET (5 MG TOTAL) BY MOUTH DAILY. 30 tablet 3   benzonatate (TESSALON) 100 MG capsule Take 1 capsule (100 mg total) by mouth every 8 (eight) hours. (Patient not taking: Reported on 09/09/2021) 21 capsule 0   coconut oil OIL Apply 1 application topically as needed.  0   escitalopram (LEXAPRO) 10 MG tablet Take 1 tablet (10 mg total) by mouth daily. 30 tablet 3   hydrocortisone (ANUSOL-HC) 2.5 % rectal cream Place rectally 2 (two) times daily. (Patient not taking: Reported on 09/09/2021) 30 g 2   ibuprofen (ADVIL) 600 MG tablet Take 1 tablet (600 mg total) by mouth every 6 (six) hours as needed. 30 tablet 0   naproxen (NAPROSYN) 500 MG tablet Take 1 tablet (500 mg total) by mouth 2 (two) times daily with a meal. (Patient not taking: Reported on 09/09/2021) 30 tablet 0   Prenatal Vit-Fe Fumarate-FA  (PRENATAL MULTIVITAMIN) TABS tablet Take 1 tablet by mouth daily at 12 noon. (Patient not taking: Reported on 09/09/2021)     No current facility-administered medications for this visit.    No Known Allergies  Family History  Problem Relation Age of Onset   Depression Mother    Miscarriages / Stillbirths Mother    Depression Father    Hypertension Father    Stroke Father    Aneurysm Father    Drug abuse Brother    Asthma Brother    Heart disease Maternal Grandmother    Hypertension Maternal Grandmother    Early death Maternal Grandfather    Drug abuse Maternal Grandfather     Social History   Socioeconomic History   Marital status: Legally Separated    Spouse name: Not on file   Number of children: 2   Years of education: Not on file   Highest education level: Not on file  Occupational History   Not on file  Tobacco Use   Smoking status: Never   Smokeless tobacco: Never  Vaping Use   Vaping Use: Never used  Substance and Sexual Activity   Alcohol use: Not Currently   Drug use: Never   Sexual activity: Yes    Birth control/protection: None  Other Topics Concern   Not on file  Social History Narrative   Not on file   Social Determinants of Health   Financial Resource Strain: Not on file  Food Insecurity: Not on file  Transportation Needs: Not on file  Physical Activity: Not on file  Stress: Not on file  Social Connections: Not on file  Intimate Partner Violence: Not on file     Constitutional: Denies fever, malaise, fatigue, headache or abrupt weight changes.  HEENT: Denies eye pain, eye redness, ear pain, ringing in the ears, wax buildup, runny nose, nasal congestion, bloody nose, or sore throat. Respiratory: Denies difficulty breathing, shortness of breath, cough or sputum production.   Cardiovascular: Denies chest pain, chest tightness, palpitations or swelling in the hands or feet.  Gastrointestinal: Denies abdominal pain, bloating, constipation,  diarrhea or blood in the stool.   No other specific complaints in a complete review of systems (except as listed in HPI above).  Objective:   Physical Exam   BP (!) 119/94 (BP Location: Right Arm, Patient Position: Sitting, Cuff Size: Normal)   Pulse 92   Temp 97.7 F (36.5 C) (Temporal)   Resp 18   Ht 5\' 8"  (1.727 m)   Wt 203 lb (92.1 kg)   SpO2 99%   BMI 30.87 kg/m   Wt Readings from Last 3 Encounters:  07/24/21 185 lb (83.9 kg)  07/24/21 185 lb (83.9 kg)  02/21/21 197 lb (89.4 kg)    General: Appears her stated age, obese, in NAD. HEENT: Head: normal shape and size; Eyes: sclera white and EOMs intact;   Cardiovascular: Normal rate and rhythm.  Pulmonary/Chest: Normal effort and positive vesicular breath sounds. No respiratory distress. No wheezes, rales or ronchi noted.  Neurological: Alert and oriented   BMET    Component Value Date/Time   NA 136 01/02/2021 0126   K 3.5 01/02/2021 0126   CL 107 01/02/2021 0126   CO2 22 01/02/2021 0126   GLUCOSE 88 01/02/2021 0126   BUN 5 (L) 01/02/2021 0126   CREATININE 0.47 01/02/2021 0126   CALCIUM 8.5 (L) 01/02/2021 0126   GFRNONAA >60 01/02/2021 0126    Lipid Panel  No results found for: CHOL, TRIG, HDL, CHOLHDL, VLDL, LDLCALC  CBC    Component Value Date/Time   WBC 12.0 (H) 01/02/2021 1418   RBC 3.66 (L) 01/02/2021 1418   HGB 12.1 01/02/2021 1418   HGB 11.5 10/25/2020 0939   HGB 12.9 05/30/2020 0000   HCT 35.4 (L) 01/02/2021 1418   HCT 33.1 (L) 10/25/2020 0939   HCT 39 05/30/2020 0000   PLT 193 01/02/2021 1418   PLT 230 10/25/2020 0939   PLT 253 05/30/2020 0000   MCV 96.7 01/02/2021 1418   MCV 94 10/25/2020 0939   MCH 33.1 01/02/2021 1418   MCHC 34.2 01/02/2021 1418   RDW 13.6 01/02/2021 1418   RDW 12.5 10/25/2020 0939   LYMPHSABS 0.3 (L) 10/13/2020 1343   MONOABS 0.5 10/13/2020 1343   EOSABS 0.0 10/13/2020 1343   BASOSABS 0.0 10/13/2020 1343    Hgb A1C No results found for: HGBA1C          Assessment & Plan:   UC Follow up for Vomiting, Viral Illness:  UC notes and lab reviewed Symptoms have resolved Return to work note provided  Return precautions discussed  12/11/2020, NP This visit occurred during the SARS-CoV-2 public health emergency.  Safety protocols were in place, including screening questions prior to the visit, additional usage of staff PPE, and extensive cleaning of exam room  while observing appropriate contact time as indicated for disinfecting solutions.

## 2021-10-02 ENCOUNTER — Encounter: Payer: Medicaid Other | Admitting: Internal Medicine

## 2022-03-20 ENCOUNTER — Ambulatory Visit
Admission: RE | Admit: 2022-03-20 | Discharge: 2022-03-20 | Disposition: A | Discharge status: Home / Self Care | Payer: Medicaid Other | Source: Ambulatory Visit | Attending: Nurse Practitioner | Admitting: Nurse Practitioner

## 2022-03-20 VITALS — BP 147/101 | HR 121 | Temp 98.9°F | Resp 18

## 2022-03-20 DIAGNOSIS — B349 Viral infection, unspecified: Secondary | ICD-10-CM

## 2022-03-20 MED ORDER — PSEUDOEPH-BROMPHEN-DM 30-2-10 MG/5ML PO SYRP: 5.0000 mL | ORAL_SOLUTION | Freq: Four times a day (QID) | ORAL | 0 refills | Status: DC | PRN

## 2022-03-20 MED ORDER — FLUTICASONE PROPIONATE 50 MCG/ACT NA SUSP: 2.0000 | Freq: Every day | NASAL | 0 refills | Status: DC

## 2022-03-20 NOTE — Discharge Instructions (Addendum)
COVID/flu results are pending.  You will be contacted if your results are positive.  If you have access to MyChart, you will also be able to see them there. Take medication as prescribed. Increase fluids and allow for plenty of rest. Recommend ibuprofen as needed for pain, fever, or general discomfort.  Take medication with food and water. Recommend using a humidifier at bedtime during sleep to help with cough and nasal congestion. Sleep elevated on 2 pillows while cough persists. Follow-up if your symptoms do not improve within the next 5 to 7 days.Marland Kitchen

## 2022-03-20 NOTE — ED Triage Notes (Signed)
Cough and pain in chest when coughing, lower back since Tuesday.  Fever last night.  States eyes have been draining and hurt.  Pain in eyes is worse at night time.

## 2022-03-20 NOTE — ED Provider Notes (Signed)
[facility]    CSN: [id number] Arrival date & time: 03/20/22  1302      History   Chief Complaint Chief Complaint  Patient presents with   Cough    Headache, nausea, goopy, crusty eyes, extreme fatigue. - Entered by patient    HPI Laura Vaughan is a 31 y.o. female.   The history is provided by the patient.   Patient presents with upper respiratory symptoms that been present for the past 2 days.  Patient states she is coughing, has nasal congestion, fatigue, and headache.  She states last evening she had a fever of 100.3.  She also complains of eye redness, and drainage, with pain at night.  Patient describes her pain as "dryness".  She states that she has had some drainage more so at nighttime.  She denies sore throat, ear pain, shortness of breath, or GI symptoms.  She has been taking ibuprofen and using a decongestant for her symptoms.  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  Past Medical History:  Diagnosis Date   Asthma    Childhood asthma 10/02/2020   COVID-19 affecting pregnancy in third trimester 10/17/2020   Dx 1/8   Depression    Depression, recurrent (HCC) 10/02/2020   Hypertension     Patient Active Problem List   Diagnosis Date Noted   Hypertension 07/24/2021   IUD (intrauterine device) in place 01/07/2021    Past Surgical History:  Procedure Laterality Date   EYE SURGERY     cateracts    OB History     Gravida  7   Para  3   Term  3   Preterm      AB  4   Living  3      SAB  4   IAB      Ectopic      Multiple  0   Live Births  3        Obstetric Comments  Had Preeclampsia in 2014-- not on magnesium per her recall          Home Medications    Prior to Admission medications   Medication Sig Start Date End Date Taking? Authorizing Provider  brompheniramine-pseudoephedrine-DM 30-2-10 MG/5ML syrup Take 5 mLs by mouth 4 (four) times daily as  needed. 03/20/22  Yes Matei Magnone-Warren, Sadie Haber, NP  fluticasone (FLONASE) 50 MCG/ACT nasal spray Place 2 sprays into both nostrils daily. 03/20/22  Yes Yunique Dearcos-Warren, Sadie Haber, NP  acetaminophen (TYLENOL) 500 MG tablet Take 2 tablets (1,000 mg total) by mouth every 6 (six) hours as needed (for pain scale < 4). 01/03/21   Alric Seton, MD  amLODipine (NORVASC) 5 MG tablet TAKE 1 TABLET (5 MG TOTAL) BY MOUTH DAILY. Patient not taking: Reported on 09/16/2021 02/21/21 02/21/22  Anyanwu, Jethro Bastos, MD  escitalopram (LEXAPRO) 10 MG tablet Take 1 tablet (10 mg total) by mouth daily. 02/21/21   Anyanwu, Jethro Bastos, MD  hydrocortisone (ANUSOL-HC) 2.5 % rectal cream Place rectally 2 (two) times daily. 02/21/21   Anyanwu, Jethro Bastos, MD  ibuprofen (ADVIL) 600 MG tablet Take 1 tablet (600 mg total) by mouth every 6 (six) hours as needed. 01/03/21   Alric Seton, MD    Family History Family History  Problem Relation Age of Onset   Depression Mother    Miscarriages / [place] Mother    Depression Father    Hypertension Father  Stroke Father    Aneurysm Father    Drug abuse Brother    Asthma Brother    Heart disease Maternal Grandmother    Hypertension Maternal Grandmother    Early death Maternal Grandfather    Drug abuse Maternal Grandfather     Social History Social History   Tobacco Use   Smoking status: Never   Smokeless tobacco: Never  Vaping Use   Vaping Use: Never used  Substance Use Topics   Alcohol use: Not Currently   Drug use: Never     Allergies   Patient has no known allergies.   Review of Systems Review of Systems Per HPI  Physical Exam Triage Vital Signs ED Triage Vitals  Enc Vitals Group     BP 03/20/22 1321 (!) 147/101     Pulse Rate 03/20/22 1321 (!) 121     Resp 03/20/22 1321 18     Temp 03/20/22 1321 98.9 F (37.2 C)     Temp Source 03/20/22 1321 Oral     SpO2 03/20/22 1321 97 %     Weight --      Height --      Head Circumference --       Peak Flow --      Pain Score 03/20/22 1324 6     Pain Loc --      Pain Edu? --      Excl. in GC? --    No data found.  Updated Vital Signs BP (!) 147/101 (BP Location: Right Arm)   Pulse (!) 121   Temp 98.9 F (37.2 C) (Oral)   Resp 18   SpO2 97%   Visual Acuity Right Eye Distance:   Left Eye Distance:   Bilateral Distance:    Right Eye Near:   Left Eye Near:    Bilateral Near:     Physical Exam Vitals and nursing note reviewed.  Constitutional:      General: She is not in acute distress.    Appearance: Normal appearance.  HENT:     Head: Normocephalic.     Right Ear: Tympanic membrane, ear canal and external ear normal.     Left Ear: Tympanic membrane, ear canal and external ear normal.     Nose: Congestion present.     Mouth/Throat:     Mouth: Mucous membranes are moist.     Pharynx: No posterior oropharyngeal erythema.  Eyes:     Extraocular Movements: Extraocular movements intact.     Conjunctiva/sclera: Conjunctivae normal.     Pupils: Pupils are equal, round, and reactive to light.  Cardiovascular:     Rate and Rhythm: Normal rate and regular rhythm.     Pulses: Normal pulses.     Heart sounds: Normal heart sounds.  Pulmonary:     Effort: Pulmonary effort is normal.     Breath sounds: Normal breath sounds.  Abdominal:     General: Bowel sounds are normal.     Palpations: Abdomen is soft.  Skin:    General: Skin is warm and dry.  Neurological:     General: No focal deficit present.     Mental Status: She is alert and oriented to person, place, and time.     Cranial Nerves: No cranial nerve deficit.     Sensory: No sensory deficit.     Gait: Gait normal.  Psychiatric:        Mood and Affect: Mood normal.        Behavior: Behavior normal.  UC Treatments / Results  Labs (all labs ordered are listed, but only abnormal results are displayed) Labs Reviewed  COVID-19, FLU A+B NAA    EKG   Radiology No results  found.  Procedures Procedures (including critical care time)  Medications Ordered in UC Medications - No data to display  Initial Impression / Assessment and Plan / UC Course  I have reviewed the triage vital signs and the nursing notes.  Pertinent labs & imaging results that were available during my care of the patient were reviewed by me and considered in my medical decision making (see chart for details).  Patient presents for upper respiratory symptoms and eye complaints has been present for the past 2 days.  Her vital signs do show tachycardia and elevated blood pressure.  Her exam is reassuring however.  COVID/flu test is pending.  In the interim, will provide patient with symptomatic treatment to include Bromfed and fluticasone.  Supportive care recommendations were provided to the patient.  Patient advised to follow-up if symptoms do not improve. Final Clinical Impressions(s) / UC Diagnoses   Final diagnoses:  Viral illness     Discharge Instructions      COVID/flu results are pending.  You will be contacted if your results are positive.  If you have access to MyChart, you will also be able to see them there. Take medication as prescribed. Increase fluids and allow for plenty of rest. Recommend ibuprofen as needed for pain, fever, or general discomfort.  Take medication with food and water. Recommend using a humidifier at bedtime during sleep to help with cough and nasal congestion. Sleep elevated on 2 pillows while cough persists. Follow-up if your symptoms do not improve within the next 5 to 7 days..      ED Prescriptions     Medication Sig Dispense Auth. Provider   brompheniramine-pseudoephedrine-DM 30-2-10 MG/5ML syrup Take 5 mLs by mouth 4 (four) times daily as needed. 140 mL Lydie Stammen-Warren, Sadie Haber, NP   fluticasone (FLONASE) 50 MCG/ACT nasal spray Place 2 sprays into both nostrils daily. 16 g Elija Mccamish-Warren, Sadie Haber, NP      PDMP not reviewed this  encounter.   Abran Cantor, NP 03/20/22 1358

## 2022-03-22 LAB — COVID-19, FLU A+B NAA
Influenza A, NAA: NOT DETECTED
Influenza B, NAA: NOT DETECTED
SARS-CoV-2, NAA: NOT DETECTED

## 2022-03-27 ENCOUNTER — Emergency Department (HOSPITAL_COMMUNITY)
Admission: EM | Admit: 2022-03-27 | Discharge: 2022-03-27 | Disposition: A | Discharge status: Home / Self Care | Payer: Medicaid Other | Attending: Emergency Medicine | Admitting: Emergency Medicine

## 2022-03-27 ENCOUNTER — Encounter (HOSPITAL_COMMUNITY): Payer: Self-pay | Admitting: Emergency Medicine

## 2022-03-27 DIAGNOSIS — I10 Essential (primary) hypertension: Secondary | ICD-10-CM | POA: Insufficient documentation

## 2022-03-27 DIAGNOSIS — J45909 Unspecified asthma, uncomplicated: Secondary | ICD-10-CM | POA: Diagnosis not present

## 2022-03-27 DIAGNOSIS — M545 Low back pain, unspecified: Secondary | ICD-10-CM | POA: Diagnosis present

## 2022-03-27 DIAGNOSIS — M5442 Lumbago with sciatica, left side: Secondary | ICD-10-CM | POA: Insufficient documentation

## 2022-03-27 DIAGNOSIS — M5441 Lumbago with sciatica, right side: Secondary | ICD-10-CM | POA: Diagnosis not present

## 2022-03-27 MED ORDER — ORPHENADRINE CITRATE ER 100 MG PO TB12
100.0000 mg | ORAL_TABLET | Freq: Two times a day (BID) | ORAL | 0 refills | Status: DC
Start: 2022-03-27 — End: 2022-07-01

## 2022-03-27 MED ORDER — NAPROXEN 500 MG PO TABS
500.0000 mg | ORAL_TABLET | Freq: Two times a day (BID) | ORAL | 0 refills | Status: DC
Start: 2022-03-27 — End: 2022-07-01

## 2022-03-27 MED ORDER — NAPROXEN 250 MG PO TABS
500.0000 mg | ORAL_TABLET | Freq: Once | ORAL | Status: AC
  Administered 2022-03-27: 500 mg via ORAL
  Filled 2022-03-27: qty 2

## 2022-03-27 MED ORDER — OXYCODONE-ACETAMINOPHEN 5-325 MG PO TABS
1.0000 | ORAL_TABLET | Freq: Once | ORAL | Status: AC
  Administered 2022-03-27: 1 via ORAL
  Filled 2022-03-27: qty 1

## 2022-03-27 MED ORDER — CYCLOBENZAPRINE HCL 10 MG PO TABS
10.0000 mg | ORAL_TABLET | Freq: Once | ORAL | Status: AC
  Administered 2022-03-27: 10 mg via ORAL
  Filled 2022-03-27: qty 1

## 2022-03-27 NOTE — ED Triage Notes (Signed)
Pt c/o lower back pain that started after bending over today and felt a tear in her back.

## 2022-03-27 NOTE — Discharge Instructions (Signed)
Apply ice for 30 minutes at a time, 4 times a day.  If you need additional pain relief, you may add acetaminophen.  Acetaminophen and naproxen work on pain in completely different ways, and the combination gives you better pain relief than either medication by itself.

## 2022-03-27 NOTE — ED Provider Notes (Signed)
[facility] EMERGENCY DEPARTMENT Provider Note   CSN: [id number] Arrival date & time: 03/27/22  0034     History  Chief Complaint  Patient presents with   Back Pain    Laura Vaughan is a 31 y.o. female.  The history is provided by the patient.  Back Pain She has history of hypertension, asthma and comes in because of low back pain.  She states that she was carrying her child on her hip, and bent over to pick something up and developed severe pain in her lower back in the midline.  Pain also goes into both thighs.  She states that it feels like her feet are asleep, but she can feel things normally.  She denies any bowel or bladder dysfunction and denies any weakness.  She has had prior back pain.   Home Medications Prior to Admission medications   Medication Sig Start Date End Date Taking? Authorizing Provider  acetaminophen (TYLENOL) 500 MG tablet Take 2 tablets (1,000 mg total) by mouth every 6 (six) hours as needed (for pain scale < 4). 01/03/21   Alric Seton, MD  amLODipine (NORVASC) 5 MG tablet TAKE 1 TABLET (5 MG TOTAL) BY MOUTH DAILY. Patient not taking: Reported on 09/16/2021 02/21/21 02/21/22  Tereso Newcomer, MD  brompheniramine-pseudoephedrine-DM 30-2-10 MG/5ML syrup Take 5 mLs by mouth 4 (four) times daily as needed. 03/20/22   Leath-Warren, Sadie Haber, NP  escitalopram (LEXAPRO) 10 MG tablet Take 1 tablet (10 mg total) by mouth daily. 02/21/21   Anyanwu, Jethro Bastos, MD  fluticasone (FLONASE) 50 MCG/ACT nasal spray Place 2 sprays into both nostrils daily. 03/20/22   Leath-Warren, Sadie Haber, NP  hydrocortisone (ANUSOL-HC) 2.5 % rectal cream Place rectally 2 (two) times daily. 02/21/21   Anyanwu, Jethro Bastos, MD  ibuprofen (ADVIL) 600 MG tablet Take 1 tablet (600 mg total) by mouth every 6 (six) hours as needed. 01/03/21   Alric Seton, MD      Allergies    Patient has no known allergies.    Review of Systems   Review of Systems  Musculoskeletal:  Positive for back  pain.  All other systems reviewed and are negative.   Physical Exam Updated Vital Signs BP (!) 156/116   Pulse 93   Temp 98.1 F (36.7 C) (Oral)   Resp 17   Ht 5\' 8"  (1.727 m)   Wt 92.1 kg   SpO2 98%   BMI 30.87 kg/m  Physical Exam Vitals and nursing note reviewed.   31 year old female, appears uncomfortable, but is in no acute distress. Vital signs are significant for elevated blood pressure. Oxygen saturation is 98%, which is normal. Head is normocephalic and atraumatic. PERRLA, EOMI. Oropharynx is clear. Neck is nontender and supple without adenopathy or JVD. Back is moderately tender in the mid and lower lumbar area with moderate bilateral paralumbar spasm.  Straight leg raise is positive at 30 degrees bilaterally.  There is no CVA tenderness. Lungs are clear without rales, wheezes, or rhonchi. Chest is nontender. Heart has regular rate and rhythm without murmur. Abdomen is soft, flat, nontender without masses or hepatosplenomegaly and peristalsis is normoactive. Extremities have no cyanosis or edema, full range of motion is present. Skin is warm and dry without rash. Neurologic: Mental status is normal, cranial nerves are intact.  Strength is 5/5 in all 4 extremities.  Careful sensory exam is normal.  ED Results / Procedures / Treatments    Procedures Procedures    Medications Ordered  in ED Medications  oxyCODONE-acetaminophen (PERCOCET/ROXICET) 5-325 MG per tablet 1 tablet (has no administration in time range)  naproxen (NAPROSYN) tablet 500 mg (has no administration in time range)  cyclobenzaprine (FLEXERIL) tablet 10 mg (has no administration in time range)    ED Course/ Medical Decision Making/ A&P                           Medical Decision Making  Acute low back pain with bilateral sciatica.  No red flags to suggest more serious pathology.  Old records are reviewed, and she has no relevant past visits.  There is no indication for imaging.  I have ordered a  dose of naproxen, oxycodone-acetaminophen, and cyclobenzaprine, discharged with prescriptions for naproxen and orphenadrine and told to use over-the-counter acetaminophen as needed.  Final Clinical Impression(s) / ED Diagnoses Final diagnoses:  Acute midline low back pain with bilateral sciatica  Elevated blood pressure reading with diagnosis of hypertension    Rx / DC Orders ED Discharge Orders          Ordered    naproxen (NAPROSYN) 500 MG tablet  2 times daily        03/27/22 0128    orphenadrine (NORFLEX) 100 MG tablet  2 times daily        03/27/22 0128              Dione Booze, MD 03/27/22 0140

## 2022-03-28 ENCOUNTER — Ambulatory Visit
Admission: RE | Admit: 2022-03-28 | Discharge: 2022-03-28 | Disposition: A | Discharge status: Home / Self Care | Payer: Medicaid Other | Source: Ambulatory Visit | Attending: Nurse Practitioner | Admitting: Nurse Practitioner

## 2022-03-28 VITALS — BP 137/97 | HR 103 | Temp 98.6°F | Resp 18

## 2022-03-28 DIAGNOSIS — S39012A Strain of muscle, fascia and tendon of lower back, initial encounter: Secondary | ICD-10-CM | POA: Diagnosis not present

## 2022-03-28 DIAGNOSIS — M5431 Sciatica, right side: Secondary | ICD-10-CM

## 2022-03-28 DIAGNOSIS — M545 Low back pain, unspecified: Secondary | ICD-10-CM | POA: Diagnosis not present

## 2022-03-28 DIAGNOSIS — M5432 Sciatica, left side: Secondary | ICD-10-CM | POA: Diagnosis not present

## 2022-03-28 MED ORDER — DEXAMETHASONE SODIUM PHOSPHATE 10 MG/ML IJ SOLN
10.0000 mg | INTRAMUSCULAR | Status: AC
  Administered 2022-03-28: 10 mg via INTRAMUSCULAR

## 2022-03-28 MED ORDER — PREDNISONE 50 MG PO TABS: ORAL_TABLET | ORAL | 0 refills | Status: DC

## 2022-06-15 ENCOUNTER — Emergency Department (HOSPITAL_COMMUNITY): Payer: Medicaid Other

## 2022-06-15 ENCOUNTER — Encounter (HOSPITAL_COMMUNITY): Payer: Self-pay

## 2022-06-15 ENCOUNTER — Emergency Department (HOSPITAL_COMMUNITY)
Admission: EM | Admit: 2022-06-15 | Discharge: 2022-06-15 | Disposition: A | Discharge status: Home / Self Care | Payer: Medicaid Other | Attending: Emergency Medicine | Admitting: Emergency Medicine

## 2022-06-15 ENCOUNTER — Other Ambulatory Visit: Payer: Self-pay

## 2022-06-15 DIAGNOSIS — S92352A Displaced fracture of fifth metatarsal bone, left foot, initial encounter for closed fracture: Secondary | ICD-10-CM | POA: Insufficient documentation

## 2022-06-15 DIAGNOSIS — X58XXXA Exposure to other specified factors, initial encounter: Secondary | ICD-10-CM | POA: Diagnosis not present

## 2022-06-15 DIAGNOSIS — Y990 Civilian activity done for income or pay: Secondary | ICD-10-CM | POA: Insufficient documentation

## 2022-06-15 DIAGNOSIS — Y9339 Activity, other involving climbing, rappelling and jumping off: Secondary | ICD-10-CM | POA: Insufficient documentation

## 2022-06-15 DIAGNOSIS — S99192A Other physeal fracture of left metatarsal, initial encounter for closed fracture: Secondary | ICD-10-CM

## 2022-06-15 DIAGNOSIS — S99922A Unspecified injury of left foot, initial encounter: Secondary | ICD-10-CM | POA: Diagnosis present

## 2022-06-15 MED ORDER — IBUPROFEN 800 MG PO TABS
800.0000 mg | ORAL_TABLET | Freq: Once | ORAL | Status: AC
  Administered 2022-06-15: 800 mg via ORAL
  Filled 2022-06-15: qty 1

## 2022-06-15 NOTE — ED Triage Notes (Signed)
Pt presents to ED from home with c/o left foot pain from injury jumping over fence at work, foot has been hurting since then.

## 2022-06-15 NOTE — ED Provider Notes (Signed)
[facility] EMERGENCY DEPARTMENT Provider Note   CSN: [id number] Arrival date & time: 06/15/22  2037     History  Chief Complaint  Patient presents with   Foot Injury    left    Laura Vaughan is a 31 y.o. female with noncontributory past medical history presents with concern for left foot pain from an injury jumping over fence at work.  Patient reports that she felt like something was right and was having significant pain bearing weight on the affected extremity.  Patient denies any other injuries, head injuries, she does not take any blood thinners.  No previous history of peripheral arterial disease, patient does not smoke cigarettes.   Foot Injury      Home Medications Prior to Admission medications   Medication Sig Start Date End Date Taking? Authorizing Provider  acetaminophen (TYLENOL) 500 MG tablet Take 2 tablets (1,000 mg total) by mouth every 6 (six) hours as needed (for pain scale < 4). 01/03/21   Alric Seton, MD  amLODipine (NORVASC) 5 MG tablet TAKE 1 TABLET (5 MG TOTAL) BY MOUTH DAILY. Patient not taking: Reported on 09/16/2021 02/21/21 02/21/22  Tereso Newcomer, MD  brompheniramine-pseudoephedrine-DM 30-2-10 MG/5ML syrup Take 5 mLs by mouth 4 (four) times daily as needed. 03/20/22   Leath-Warren, Sadie Haber, NP  escitalopram (LEXAPRO) 10 MG tablet Take 1 tablet (10 mg total) by mouth daily. 02/21/21   Anyanwu, Jethro Bastos, MD  fluticasone (FLONASE) 50 MCG/ACT nasal spray Place 2 sprays into both nostrils daily. 03/20/22   Leath-Warren, Sadie Haber, NP  hydrocortisone (ANUSOL-HC) 2.5 % rectal cream Place rectally 2 (two) times daily. 02/21/21   Anyanwu, Jethro Bastos, MD  ibuprofen (ADVIL) 600 MG tablet Take 1 tablet (600 mg total) by mouth every 6 (six) hours as needed. 01/03/21   Alric Seton, MD  naproxen (NAPROSYN) 500 MG tablet Take 1 tablet (500 mg total) by mouth 2 (two) times daily. 03/27/22   Dione Booze, MD  orphenadrine (NORFLEX) 100 MG tablet Take 1 tablet  (100 mg total) by mouth 2 (two) times daily. 03/27/22   Dione Booze, MD  predniSONE (DELTASONE) 50 MG tablet Take 1 tablet daily with breakfast for 5 days. 03/28/22   Leath-Warren, Sadie Haber, NP      Allergies    Patient has no known allergies.    Review of Systems   Review of Systems  Musculoskeletal:  Positive for joint swelling.  All other systems reviewed and are negative.   Physical Exam Updated Vital Signs BP (!) 136/101 (BP Location: Right Arm)   Pulse (!) 107   Temp 98 F (36.7 C) (Oral)   Resp 20   Ht 5\' 8"  (1.727 m)   Wt 90.7 kg   SpO2 99%   BMI 30.41 kg/m  Physical Exam Vitals and nursing note reviewed.  Constitutional:      General: She is not in acute distress.    Appearance: Normal appearance.  HENT:     Head: Normocephalic and atraumatic.  Eyes:     General:        Right eye: No discharge.        Left eye: No discharge.  Cardiovascular:     Rate and Rhythm: Normal rate and regular rhythm.  Pulmonary:     Effort: Pulmonary effort is normal. No respiratory distress.  Musculoskeletal:        General: No deformity.     Comments: Patient with intact plantarflexion, dorsiflexion of the left lower extremity,  she has no tenderness palpation over either medial or lateral malleolus.  She does have significant tenderness palpation on the plantar aspect of the left foot on the lateral aspect.  No obvious step-off or deformity noted.  Skin:    General: Skin is warm and dry.  Neurological:     Mental Status: She is alert and oriented to person, place, and time.  Psychiatric:        Mood and Affect: Mood normal.        Behavior: Behavior normal.     ED Results / Procedures / Treatments   Labs (all labs ordered are listed, but only abnormal results are displayed) Labs Reviewed - No data to display  EKG None  Radiology DG Foot Complete Left  Result Date: 06/15/2022 CLINICAL DATA:  Left foot pain laterally EXAM: LEFT FOOT - COMPLETE 3+ VIEW COMPARISON:   None Available. FINDINGS: There is a fracture across the proximal shaft of the left 5th metatarsal, nondisplaced. No subluxation or dislocation. Soft tissues are intact. IMPRESSION: Nondisplaced fracture through the proximal shaft of the left 5th metatarsal (Jones fracture). Electronically Signed   By: Charlett Nose M.D.   On: 06/15/2022 21:24    Procedures Procedures    Medications Ordered in ED Medications  ibuprofen (ADVIL) tablet 800 mg (800 mg Oral Given 06/15/22 2240)    ED Course/ Medical Decision Making/ A&P                           Medical Decision Making Amount and/or Complexity of Data Reviewed Radiology: ordered.  Risk Prescription drug management.   This an overall well-appearing 31 year old female who presents with concern for acute left foot pain, difficulty weightbearing after injury jumping over a fence earlier today.  My emergent differential diagnosis includes acute fracture, dislocation versus soft tissue injury, ankle sprain versus other.  This is not an exhaustive differential.  On physical exam patient is neurovascularly intact, does have significant tenderness on the lateral aspect of left foot around the fifth metatarsal.  I independently interpreted imaging including plain film radiograph of the left foot which shows acute proximal fifth metatarsal fracture, Jones fracture. I agree with the radiologist interpretation.  Patient placed in posterior ankle splint, provided with crutches, discussed the importance of nonweightbearing for this injury to prevent nonunion.  Patient given orthopedic follow-up, Note for work, encouraged ibuprofen, Tylenol, rest, ice, compression, elevation of the affected extremity.  Patient understands and agrees to this plan, and is discharged in stable condition at this time.  Final Clinical Impression(s) / ED Diagnoses Final diagnoses:  Closed fracture of base of fifth metatarsal bone of left foot at metaphyseal-diaphyseal junction,  initial encounter    Rx / DC Orders ED Discharge Orders     None         [place] 06/15/22 2243    Bethann Berkshire, MD 06/16/22 1248

## 2022-06-15 NOTE — Discharge Instructions (Signed)
Please use Tylenol or ibuprofen for pain.  You may use 600 mg ibuprofen every 6 hours or 1000 mg of Tylenol every 6 hours.  You may choose to alternate between the 2.  This would be most effective.  Not to exceed 4 g of Tylenol within 24 hours.  Not to exceed 3200 mg ibuprofen 24 hours.  Recommend rest, ice, compression, elevation of the affected extremity.  Remain in the splint that we have placed you in, keep your foot clean and dry until you are able to follow-up with orthopedics.  This is a strict nonweightbearing injury.  As we discussed it has a high rate of nonunion if the proper steps are not followed.  Please call the orthopedic surgeon in the morning to see when they would like to see you for follow-up and further evaluation.  In the meantime I provided you with a work note explaining the nature of your injury.

## 2022-06-17 ENCOUNTER — Ambulatory Visit (INDEPENDENT_AMBULATORY_CARE_PROVIDER_SITE_OTHER): Payer: Medicaid Other | Admitting: Orthopedic Surgery

## 2022-06-17 ENCOUNTER — Encounter: Payer: Self-pay | Admitting: Orthopedic Surgery

## 2022-06-17 VITALS — Ht 68.0 in | Wt 200.0 lb

## 2022-06-17 DIAGNOSIS — S92355A Nondisplaced fracture of fifth metatarsal bone, left foot, initial encounter for closed fracture: Secondary | ICD-10-CM | POA: Diagnosis not present

## 2022-06-17 MED ORDER — HYDROCODONE-ACETAMINOPHEN 5-325 MG PO TABS: 1.0000 | ORAL_TABLET | Freq: Four times a day (QID) | ORAL | 0 refills | Status: DC | PRN

## 2022-06-17 NOTE — Patient Instructions (Signed)
Non weightbearing on the left leg.  Continue to use the crutches.  Wear the boot at all times when walking, okay to remove for hygiene and at nighttime in bed.  Please provide a letter for work, work until the next visit in 2 weeks.  Hydrocodone as needed for pain.

## 2022-06-18 ENCOUNTER — Ambulatory Visit: Payer: Self-pay

## 2022-06-18 ENCOUNTER — Encounter: Payer: Self-pay | Admitting: Orthopedic Surgery

## 2022-06-18 ENCOUNTER — Other Ambulatory Visit: Payer: Self-pay

## 2022-06-18 ENCOUNTER — Encounter (HOSPITAL_COMMUNITY): Payer: Self-pay | Admitting: *Deleted

## 2022-06-18 ENCOUNTER — Emergency Department (HOSPITAL_COMMUNITY)
Admission: EM | Admit: 2022-06-18 | Discharge: 2022-06-18 | Disposition: A | Discharge status: Home / Self Care | Payer: Medicaid Other | Attending: Emergency Medicine | Admitting: Emergency Medicine

## 2022-06-18 DIAGNOSIS — S20152A Superficial foreign body of breast, left breast, initial encounter: Secondary | ICD-10-CM | POA: Diagnosis not present

## 2022-06-18 DIAGNOSIS — I10 Essential (primary) hypertension: Secondary | ICD-10-CM | POA: Diagnosis not present

## 2022-06-18 DIAGNOSIS — J45909 Unspecified asthma, uncomplicated: Secondary | ICD-10-CM | POA: Insufficient documentation

## 2022-06-18 DIAGNOSIS — Z79899 Other long term (current) drug therapy: Secondary | ICD-10-CM | POA: Insufficient documentation

## 2022-06-18 DIAGNOSIS — X58XXXA Exposure to other specified factors, initial encounter: Secondary | ICD-10-CM | POA: Insufficient documentation

## 2022-06-18 MED ORDER — LIDOCAINE HCL (PF) 1 % IJ SOLN
10.0000 mL | Freq: Once | INTRAMUSCULAR | Status: AC
  Administered 2022-06-18: 10 mL
  Filled 2022-06-18: qty 10

## 2022-06-18 NOTE — ED Triage Notes (Signed)
Pt with left nipple piercing about 3-4 weeks ago.  Pt noted one end is under skin.

## 2022-06-18 NOTE — Progress Notes (Signed)
New Patient Visit  Assessment: Laura Vaughan is a 31 y.o. female with the following: Left 5th metatarsal fracture, minimally displaced  Plan: Addelynn Batte has a fracture at the base of the fifth metatarsal.  There is a minimally displaced.  It is amenable to nonoperative management.  There is a small chance that this could go on to a nonunion, however I think this is a very low risk.  She has done well in a walking boot.  We will keep her hand in her current boot.  She is to remain nonweightbearing.  I would like to check on her in a couple of weeks to make sure that she is not having any issues.  We provided documentation excusing her from work.  Follow-up: Return in about 2 weeks (around 07/01/2022).  Subjective:  Chief Complaint  Patient presents with   Fracture    L Foot/ DOI 06/15/22 was doing the physical test for the [employer], foot started hurting and it felt like it popped, but I kept going.    History of Present Illness: Laura Vaughan is a 31 y.o. female who presents for evaluation of left foot pain.  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  She jumped over a fence, and landed and felt a pop.  She had immediate pain.  She did finish her testing.  However, the pain worsened, and she presented to the emergency department for evaluation.  X-rays demonstrated a minimally displaced fracture of the fifth metatarsal.  She was placed in a boot, given crutches.  She has done well since then.  She has remained off of her feet.  She is elevating her foot, taking medications as needed.  Tylenol and ibuprofen have not been providing patient relief.   Review of Systems: No fevers or chills No numbness or tingling No chest pain No shortness of breath No bowel or bladder dysfunction No GI distress No headaches   Medical History:  Past Medical History:  Diagnosis Date   Asthma    Childhood asthma 10/02/2020    COVID-19 affecting pregnancy in third trimester 10/17/2020   Dx 1/8   Depression    Depression, recurrent (HCC) 10/02/2020   Hypertension     Past Surgical History:  Procedure Laterality Date   EYE SURGERY     cateracts    Family History  Problem Relation Age of Onset   Depression Mother    Miscarriages / Stillbirths Mother    Depression Father    Hypertension Father    Stroke Father    Aneurysm Father    Drug abuse Brother    Asthma Brother    Heart disease Maternal Grandmother    Hypertension Maternal Grandmother    Early death Maternal Grandfather    Drug abuse Maternal Grandfather    Social History   Tobacco Use   Smoking status: Never   Smokeless tobacco: Never  Vaping Use   Vaping Use: Never used  Substance Use Topics   Alcohol use: Not Currently   Drug use: Never    No Known Allergies  Current Meds  Medication Sig   acetaminophen (TYLENOL) 500 MG tablet Take 2 tablets (1,000 mg total) by mouth every 6 (six) hours as needed (for pain scale < 4).   escitalopram (LEXAPRO) 10 MG tablet Take 1 tablet (10 mg total) by mouth daily.   HYDROcodone-acetaminophen (NORCO/VICODIN) 5-325 MG tablet Take 1 tablet by mouth every 6 (six) hours as needed  for moderate pain.    Objective: Ht 5\' 8"  (1.727 m)   Wt 200 lb (90.7 kg)   BMI 30.41 kg/m   Physical Exam:  General: Alert and oriented. and No acute distress. Gait: Ambulates with the assistance of a crutches.  Evaluation left foot demonstrates diffuse swelling.  She has some bruising over the lateral aspect of her foot.  Point tenderness at the base of the fifth metatarsal.  Sensation is intact over the dorsum of her foot.  Toes are warm and well-perfused.  IMAGING: I personally reviewed images previously obtained from the ED  Minimally displaced fracture through the base of the fifth metatarsal.  Fracture line is visible laterally, but unclear if it extends through the medial cortex.     New Medications:   Meds ordered this encounter  Medications   HYDROcodone-acetaminophen (NORCO/VICODIN) 5-325 MG tablet    Sig: Take 1 tablet by mouth every 6 (six) hours as needed for moderate pain.    Dispense:  20 tablet    Refill:  0      , MD  06/18/2022 9:56 AM

## 2022-06-18 NOTE — ED Provider Notes (Signed)
[facility] EMERGENCY DEPARTMENT Provider Note   CSN: [id number] Arrival date & time: 06/18/22  5784     History  Chief Complaint  Patient presents with   Foreign Body in Skin    Laura Vaughan is a 31 y.o. female.  Laura Vaughan is a 31 y.o. female with a history of hypertension, depression, and asthma, who presents to the ED for an issue with a nipple piercing.  She reports that she had her nipples pierced about 3 to 4 weeks ago.  She noted today that one of the balls on the end of the ball or going through her left nipple has gotten pulled under the skin and is no longer sticking out is supposed to.  She reports that she tried to push it back through but it was too painful.  She has not noticed any drainage, redness or swelling around the piercing.  No bleeding.  Who blown up her phone today that is what I figured she will both have a ton of crap to do today  The history is provided by the patient.       Home Medications Prior to Admission medications   Medication Sig Start Date End Date Taking? Authorizing Provider  amLODipine (NORVASC) 5 MG tablet TAKE 1 TABLET (5 MG TOTAL) BY MOUTH DAILY. Patient not taking: Reported on 09/16/2021 02/21/21 02/21/22  Anyanwu, Jethro Bastos, MD  escitalopram (LEXAPRO) 10 MG tablet Take 1 tablet (10 mg total) by mouth daily. Patient not taking: Reported on 07/01/2022 02/21/21   Tereso Newcomer, MD      Allergies    Patient has no known allergies.    Review of Systems   Review of Systems  Constitutional:  Negative for chills and fever.  Skin:        Piercing issue    Physical Exam Updated Vital Signs BP 131/89 (BP Location: Right Arm)   Pulse (!) 101   Temp 98 F (36.7 C) (Oral)   Resp 16   Ht 5\' 8"  (1.727 m)   Wt 90.7 kg   SpO2 92%   BMI 30.41 kg/m  Physical Exam Vitals and nursing note reviewed.  Constitutional:      General: She is not in acute distress.    Appearance: Normal appearance. She is well-developed. She is not  ill-appearing or diaphoretic.  HENT:     Head: Normocephalic and atraumatic.  Eyes:     General:        Right eye: No discharge.        Left eye: No discharge.  Pulmonary:     Effort: Pulmonary effort is normal. No respiratory distress.  Chest:     Comments: Left nipple with piercing present, there is a ball or that is supposed to have 2 metal balls on either in, 1 end of the piercing has gone under the skin within the nipple.  No surrounding erythema, purulent drainage, bleeding or swelling. Neurological:     Mental Status: She is alert and oriented to person, place, and time.     Coordination: Coordination normal.  Psychiatric:        Mood and Affect: Mood normal.        Behavior: Behavior normal.     ED Results / Procedures / Treatments   Labs (all labs ordered are listed, but only abnormal results are displayed) Labs Reviewed - No data to display  EKG None  Radiology No results found.  Procedures .Foreign Body Removal  Date/Time: 07/04/2022 10:12 AM  Performed by: Dartha Lodge, PA-C Authorized by: Dartha Lodge, PA-C  Consent: Verbal consent obtained. Risks and benefits: risks, benefits and alternatives were discussed Consent given by: patient Patient understanding: patient states understanding of the procedure being performed Patient identity confirmed: verbally with patient Body area: skin General location: trunk Location details: left breast Anesthesia: local infiltration  Anesthesia: Local Anesthetic: lidocaine 1% without epinephrine  Sedation: Patient sedated: no  Patient cooperative: yes Localization method: visualized Removal mechanism: manually removed by hand. Dressing: dressing applied Complexity: simple 1 objects recovered. Objects recovered: The end of piercing that had gone into the skin has been pushed back through the piercing tract Patient tolerance: patient tolerated the procedure well with no immediate complications Comments:  Patient wishes to try and keep and preserve the piercing.  If the ball on the end of the piercing barb was pushed back through the piercing tract and is now outside of the skin on both Brose.  Dressing applied.  Patient tolerated well.      Medications Ordered in ED Medications  lidocaine (PF) (XYLOCAINE) 1 % injection 10 mL (10 mLs Infiltration Given 06/18/22 1755)    ED Course/ Medical Decision Making/ A&P                           Medical Decision Making Risk Prescription drug management.   31 year old female presents with issue with left nipple piercing.  The ball on 1 end of the bar has been pulled into the piercing tract and patient unable to push it back through.  Lidocaine injected locally for anesthesia and then the piercing was able to be pushed back through the tract and return to its appropriate position.  Patient would like to preserve the piercing so jewelry was left in the correct place with dressing applied.  We will return precautions and infection precautions discussed.  Discharged home in good condition.        Final Clinical Impression(s) / ED Diagnoses Final diagnoses:  Foreign body of skin of breast, left, initial encounter    Rx / DC Orders ED Discharge Orders     None         Legrand Rams 07/04/22 1015    Gloris Manchester, MD 07/06/22 [phone]

## 2022-06-18 NOTE — Discharge Instructions (Signed)
Clean piercing twice daily the, if you notice swelling, drainage or increasing pain or if you notice that the ball of the piercing continues to slip back into the same position you will likely need to have the piercing removed.  If you continue having issues with a you can also follow-up with a local piercing shop to see if they can help with issues.

## 2022-06-25 ENCOUNTER — Other Ambulatory Visit (HOSPITAL_COMMUNITY)
Admission: RE | Admit: 2022-06-25 | Discharge: 2022-06-25 | Disposition: A | Discharge status: Home / Self Care | Payer: Medicaid Other | Source: Ambulatory Visit | Attending: Obstetrics and Gynecology | Admitting: Obstetrics and Gynecology

## 2022-06-25 ENCOUNTER — Ambulatory Visit (INDEPENDENT_AMBULATORY_CARE_PROVIDER_SITE_OTHER): Payer: Medicaid Other | Admitting: Obstetrics and Gynecology

## 2022-06-25 ENCOUNTER — Encounter: Payer: Self-pay | Admitting: Obstetrics and Gynecology

## 2022-06-25 VITALS — BP 144/95 | HR 101 | Ht 68.0 in | Wt 200.0 lb

## 2022-06-25 DIAGNOSIS — R102 Pelvic and perineal pain: Secondary | ICD-10-CM | POA: Diagnosis not present

## 2022-06-25 DIAGNOSIS — N898 Other specified noninflammatory disorders of vagina: Secondary | ICD-10-CM | POA: Diagnosis not present

## 2022-06-25 DIAGNOSIS — Z7251 High risk heterosexual behavior: Secondary | ICD-10-CM

## 2022-06-25 DIAGNOSIS — N3000 Acute cystitis without hematuria: Secondary | ICD-10-CM | POA: Diagnosis not present

## 2022-06-25 LAB — POCT URINALYSIS DIPSTICK
Bilirubin, UA: 0.2
Blood, UA: NEGATIVE
Glucose, UA: NEGATIVE
Protein, UA: POSITIVE — AB
Spec Grav, UA: 1.025 (ref 1.010–1.025)
Urobilinogen, UA: 1 E.U./dL
pH, UA: 6.5 (ref 5.0–8.0)

## 2022-06-25 LAB — POCT PREGNANCY, URINE: Preg Test, Ur: NEGATIVE

## 2022-06-25 MED ORDER — FLUCONAZOLE 150 MG PO TABS: 150.0000 mg | ORAL_TABLET | ORAL | 0 refills | Status: DC

## 2022-06-25 MED ORDER — SULFAMETHOXAZOLE-TRIMETHOPRIM 800-160 MG PO TABS: 1.0000 | ORAL_TABLET | Freq: Two times a day (BID) | ORAL | 0 refills | Status: AC

## 2022-06-25 NOTE — Progress Notes (Signed)
Obstetrics and Gynecology Visit Return Patient Evaluation  Appointment Date: 06/25/2022  OBGYN Clinic: [facility]  Chief Complaint: IUD check up  History of Present Illness:  Laura Vaughan is a 31 y.o. with cramping, spotting for the past few days to week. Liletta placed May 2022 and she does not have periods with it. Patient also endorses vaginal odor, no itching or irritation  Review of Systems: as noted in the History of Present Illness.  Patient Active Problem List   Diagnosis Date Noted   Hypertension 07/24/2021   IUD (intrauterine device) in place 01/07/2021   Medications:  Laura Vaughan had no medications administered during this visit. Current Outpatient Medications  Medication Sig Dispense Refill   HYDROcodone-acetaminophen (NORCO/VICODIN) 5-325 MG tablet Take 1 tablet by mouth every 6 (six) hours as needed for moderate pain. 20 tablet 0   acetaminophen (TYLENOL) 500 MG tablet Take 2 tablets (1,000 mg total) by mouth every 6 (six) hours as needed (for pain scale < 4). (Patient not taking: Reported on 06/25/2022)     amLODipine (NORVASC) 5 MG tablet TAKE 1 TABLET (5 MG TOTAL) BY MOUTH DAILY. (Patient not taking: Reported on 09/16/2021) 30 tablet 3   brompheniramine-pseudoephedrine-DM 30-2-10 MG/5ML syrup Take 5 mLs by mouth 4 (four) times daily as needed. (Patient not taking: Reported on 06/25/2022) 140 mL 0   escitalopram (LEXAPRO) 10 MG tablet Take 1 tablet (10 mg total) by mouth daily. (Patient not taking: Reported on 06/25/2022) 30 tablet 3   fluticasone (FLONASE) 50 MCG/ACT nasal spray Place 2 sprays into both nostrils daily. (Patient not taking: Reported on 06/25/2022) 16 g 0   hydrocortisone (ANUSOL-HC) 2.5 % rectal cream Place rectally 2 (two) times daily. (Patient not taking: Reported on 06/25/2022) 30 g 2   ibuprofen (ADVIL) 600 MG tablet Take 1 tablet (600 mg total) by mouth every 6 (six) hours as needed. (Patient not taking: Reported on  06/25/2022) 30 tablet 0   naproxen (NAPROSYN) 500 MG tablet Take 1 tablet (500 mg total) by mouth 2 (two) times daily. (Patient not taking: Reported on 06/25/2022) 30 tablet 0   orphenadrine (NORFLEX) 100 MG tablet Take 1 tablet (100 mg total) by mouth 2 (two) times daily. (Patient not taking: Reported on 06/25/2022) 30 tablet 0   No current facility-administered medications for this visit.    Allergies: has No Known Allergies.  Physical Exam:  BP (!) 144/95   Pulse (!) 101   Ht 5\' 8"  (1.727 m)   Wt 200 lb (90.7 kg)   BMI 30.41 kg/m  Body mass index is 30.41 kg/m. General appearance: Well nourished, well developed female in no acute distress.  Abdomen: diffusely non tender to palpation, non distended, and no masses, hernias Neuro/Psych:  Normal mood and affect.    Pelvic exam:  EGBUS: mild b/l erythema Vaginal vault: +white, cottage cheese like discharge, no blood Cervix:  IUD strings 3-4cm coming from os Bimanual: negative  U/a POC: +nitrit, leuks, protein   Assessment: patient stable  Plan:  1. Pelvic cramping Bactrim and diflucan sent in - POCT Pregnancy, Urine - POCT urinalysis dipstick - Culture, OB Urine  2. Unprotected sexual intercourse - Cervicovaginal ancillary only( [place])  3. Vaginal odor  4. Acute cystitis without hematuria   RTC: PRN  Durene Romans MD Attending [facility])

## 2022-06-25 NOTE — Progress Notes (Signed)
Patient presents for IUD check. Reports abdominal cramping x3 days and spotting the following week. Does not typically have any spotting. Concerned for pregnancy.    Placed 12/2020. Strings palpable.  Reports unprotected intercourse on 8/16.   Denies vaginal symptoms, desires STD check. Will return for bloodwork.

## 2022-06-26 LAB — CERVICOVAGINAL ANCILLARY ONLY
Bacterial Vaginitis (gardnerella): NEGATIVE
Candida Glabrata: NEGATIVE
Candida Vaginitis: NEGATIVE
Chlamydia: NEGATIVE
Comment: NEGATIVE
Comment: NEGATIVE
Comment: NEGATIVE
Comment: NEGATIVE
Comment: NEGATIVE
Comment: NORMAL
Neisseria Gonorrhea: NEGATIVE
Trichomonas: NEGATIVE

## 2022-06-27 LAB — CULTURE, OB URINE

## 2022-06-27 LAB — URINE CULTURE, OB REFLEX

## 2022-07-01 ENCOUNTER — Ambulatory Visit (INDEPENDENT_AMBULATORY_CARE_PROVIDER_SITE_OTHER): Payer: Medicaid Other

## 2022-07-01 ENCOUNTER — Ambulatory Visit (INDEPENDENT_AMBULATORY_CARE_PROVIDER_SITE_OTHER): Payer: Medicaid Other | Admitting: Orthopedic Surgery

## 2022-07-01 ENCOUNTER — Telehealth: Payer: Self-pay

## 2022-07-01 ENCOUNTER — Encounter: Payer: Self-pay | Admitting: Orthopedic Surgery

## 2022-07-01 DIAGNOSIS — S92355D Nondisplaced fracture of fifth metatarsal bone, left foot, subsequent encounter for fracture with routine healing: Secondary | ICD-10-CM | POA: Diagnosis not present

## 2022-07-01 NOTE — Patient Instructions (Signed)
Out of work until the next visit, please provide an updated letter  Follow up in 2 weeks

## 2022-07-01 NOTE — Telephone Encounter (Signed)
Laura Vaughan is asking if her forms have been filled out and faxed to [phone] ATTN: Marcell Barlow.

## 2022-07-02 NOTE — Progress Notes (Signed)
Return patient Visit  Assessment: Laura Vaughan is a 31 y.o. female with the following: Left 5th metatarsal fracture, minimally displaced  Plan: Laura Vaughan has a fracture at the base of the fifth metatarsal.  Repeat radiographs demonstrates interval consolidation.  There has been no further displacement.  No additional injuries.  She states her pain has improved.  Swelling and bruising is also better.  Given the nature of her work at [employer] facility, she will be unable to return to work until she is out of her boot.  I would like her to continue wearing the boot, and remain nonweightbearing.  I will see her back in 2 weeks.   Follow-up: Return in 2 weeks (on 07/15/2022).  Subjective:  Chief Complaint  Patient presents with   Foot Pain    Left foot fracture/ in boot on crutches NWB states some improvement     History of Present Illness: Laura Vaughan is a 31 y.o. female who returns for evaluation of left foot pain.  Approximately 2 weeks ago, she sustained an injury to her left foot.  She has remained nonweightbearing, wearing a boot on the left foot at all times.  She is taking medicines only as needed.  No new injuries.  Her pain is getting better.   Review of Systems: No fevers or chills No numbness or tingling No chest pain No shortness of breath No bowel or bladder dysfunction No GI distress No headaches    Objective: There were no vitals taken for this visit.  Physical Exam:  General: Alert and oriented. and No acute distress. Gait: Ambulates with the assistance of a crutches.  Left foot without deformity.  Minimal swelling is appreciated on today's visit.  No bruising.  She has tenderness to palpation over the lateral midfoot.  Toes are warm and well-perfused.  Sensation is intact over the dorsum of the foot.  Active motion intact in the EHL/TA.  IMAGING: I personally reviewed images previously obtained from the ED  X-ray of the left foot was obtained in  clinic today.  This was compared to prior x-rays.  There is a fracture at the base of the fifth metatarsal, with some interval consolidation.  No further displacement is appreciated.  No acute injuries.  Impression: Stable left fifth metatarsal fracture    New Medications:  No orders of the defined types were placed in this encounter.     Mordecai Rasmussen, MD  07/02/2022 11:40 AM

## 2022-07-15 ENCOUNTER — Ambulatory Visit (INDEPENDENT_AMBULATORY_CARE_PROVIDER_SITE_OTHER): Payer: BC Managed Care – PPO

## 2022-07-15 ENCOUNTER — Ambulatory Visit (INDEPENDENT_AMBULATORY_CARE_PROVIDER_SITE_OTHER): Payer: Medicaid Other | Admitting: Orthopedic Surgery

## 2022-07-15 ENCOUNTER — Encounter: Payer: Self-pay | Admitting: Orthopedic Surgery

## 2022-07-15 DIAGNOSIS — S92355D Nondisplaced fracture of fifth metatarsal bone, left foot, subsequent encounter for fracture with routine healing: Secondary | ICD-10-CM

## 2022-07-15 NOTE — Progress Notes (Signed)
Return patient Visit  Assessment: Laura Vaughan is a 31 y.o. female with the following: Left 5th metatarsal fracture, minimally displaced  Plan: Laura Vaughan has a fracture at the base of the fifth metatarsal.  The fracture line is visible on radiographs.  Her pain, swelling and bruising have all improved.  She still has some tenderness to palpation overlying the fracture site.  As such, I recommended an additional 2 weeks of nonweightbearing in the boot.  She states understanding.  I provided her with exercises to initiate, working on range of motion with transitioning to strengthening.  She states her understanding.  At the next visit, we will transition her out of the boot, with gradual transition to a regular shoe.  Follow-up: Return in about 2 weeks (around 07/29/2022).  Subjective:  Chief Complaint  Patient presents with   Foot Injury    LT foot DOI 06/15/22 Pain is improving//has slipped a couple of times and caught herself on the left side    History of Present Illness: Laura Vaughan is a 31 y.o. female who returns for evaluation of left foot pain.  She sustained an injury to her left foot approximately 1 month ago.  She has remained nonweightbearing.  Her pain is improved.  The swelling is improved.  She notes that her bruising has gotten much better.  She is taking over-the-counter pain medications as needed.  She continues to use crutches.  She states that she wants to go back to work, but she cannot return to light duty.  Review of Systems: No fevers or chills No numbness or tingling No chest pain No shortness of breath No bowel or bladder dysfunction No GI distress No headaches    Objective: LMP  (LMP Unknown)   Physical Exam:  General: Alert and oriented. and No acute distress. Gait: Ambulates with the assistance of a crutches.  Evaluation of left foot demonstrates no bruising.  No swelling is appreciated.  Tenderness palpation over the base of the fifth  metatarsal she tolerates some gentle inversion of the ankle.  Toes warm and well-perfused.  Sensation is intact over the dorsum of the foot.  IMAGING: I personally reviewed images previously obtained from the ED  X-ray of the left foot was obtained in clinic today.  No acute injuries are noted.  There is a comminuted fracture at the base of the fifth metatarsal.  Overall alignment remains unchanged.  No evidence of avascular necrosis.  Fracture line appears to be more visible than it was prior.  No callus formation is appreciated.  Impression: Stable left fifth metatarsal base fracture   New Medications:  No orders of the defined types were placed in this encounter.     Mordecai Rasmussen, MD  07/15/2022 2:25 PM

## 2022-07-15 NOTE — Patient Instructions (Signed)
Instructions  1.  You have sustained an ankle sprain, or similar exercises that can be treated as an ankle sprain.  **These exercises can also be used as part of recovery from an ankle fracture.  2.  I encourage you to stay on your feet and gradually remove your walking boot.   3.  Below are some exercises that you can complete on your own to improve your symptoms.  4.  As an alternative, you can search for ankle sprain exercises online, and can see some demonstrations on YouTube  5.  If you are having difficulty with these exercises, we can also prescribe formal physical therapy  Ankle Exercises Ask your health care provider which exercises are safe for you. Do exercises exactly as told by your health care provider and adjust them as directed. It is normal to feel mild stretching, pulling, tightness, or mild discomfort as you do these exercises. Stop right away if you feel sudden pain or your pain gets worse. Do not begin these exercises until told by your health care provider.  Stretching and range-of-motion exercises These exercises warm up your muscles and joints and improve the movement and flexibility of your ankle. These exercises may also help to relieve pain.  Dorsiflexion/plantar flexion  Sit with your L knee straight or bent. Do not rest your foot on anything. Flex your left ankle to tilt the top of your foot toward your shin. This is called dorsiflexion. Hold this position for 5 seconds. Point your toes downward to tilt the top of your foot away from your shin. This is called plantar flexion. Hold this position for 5 seconds. Repeat 10 times. Complete this exercise 2-3 times a day.  As tolerated  Ankle alphabet  Sit with your L foot supported at your lower leg. Do not rest your foot on anything. Make sure your foot has room to move freely. Think of your L foot as a paintbrush: Move your foot to trace each letter of the alphabet in the air. Keep your hip and knee still while  you trace the letters. Trace every letter from A to Z. Make the letters as large as you can without causing or increasing any discomfort.  Repeat 2-3 times. Complete this exercise 2-3 times a day.   Strengthening exercises These exercises build strength and endurance in your ankle. Endurance is the ability to use your muscles for a long time, even after they get tired. Dorsiflexors These are muscles that lift your foot up. Secure a rubber exercise band or tube to an object, such as a table leg, that will stay still when the band is pulled. Secure the other end around your L foot. Sit on the floor, facing the object with your L leg extended. The band or tube should be slightly tense when your foot is relaxed. Slowly flex your L ankle and toes to bring your foot toward your shin. Hold this position for 5 seconds. Slowly return your foot to the starting position, controlling the band as you do that. Repeat 10 times. Complete this exercise 2-3 times a day.  Plantar flexors These are muscles that push your foot down. Sit on the floor with your L leg extended. Loop a rubber exercise band or tube around the ball of your L foot. The ball of your foot is on the walking surface, right under your toes. The band or tube should be slightly tense when your foot is relaxed. Slowly point your toes downward, pushing them away from  you. Hold this position for 5 seconds. Slowly release the tension in the band or tube, controlling smoothly until your foot is back in the starting position. Repeat 10 times. Complete this exercise 2-3 times a day.  Towel curls  Sit in a chair on a non-carpeted surface, and put your feet on the floor. Place a towel in front of your feet. Keeping your heel on the floor, put your L foot on the towel. Pull the towel toward you by grabbing the towel with your toes and curling them under. Keep your heel on the floor. Let your toes relax. Grab the towel again. Keep pulling the  towel until it is completely underneath your foot. Repeat 10 times. Complete this exercise 2-3 times a day.

## 2022-07-29 ENCOUNTER — Encounter: Payer: Self-pay | Admitting: Orthopedic Surgery

## 2022-07-29 ENCOUNTER — Ambulatory Visit (INDEPENDENT_AMBULATORY_CARE_PROVIDER_SITE_OTHER): Payer: Medicaid Other | Admitting: Orthopedic Surgery

## 2022-07-29 ENCOUNTER — Ambulatory Visit (INDEPENDENT_AMBULATORY_CARE_PROVIDER_SITE_OTHER): Payer: BC Managed Care – PPO

## 2022-07-29 VITALS — Ht 68.0 in | Wt 200.0 lb

## 2022-07-29 DIAGNOSIS — S92355D Nondisplaced fracture of fifth metatarsal bone, left foot, subsequent encounter for fracture with routine healing: Secondary | ICD-10-CM

## 2022-07-29 NOTE — Patient Instructions (Addendum)
Please provide a note for work - out until the next visit    Please provide a work note from the last visit (2 weeks ago)   Return to clinic earlier if feeling well enough to return to clinic.

## 2022-07-29 NOTE — Progress Notes (Signed)
Return patient Visit  Assessment: Laura Vaughan is a 31 y.o. female with the following: Left 5th metatarsal fracture, minimally displaced  Plan: Laura Vaughan is healing a left fifth metatarsal base fracture.  Her pain and swelling is better.  She does continue to have some tenderness.  We will allow her to start to bear weight through the left foot.  Recommend she continue to use the walking boot.  Over the next 1-2 weeks, she can transition to a regular shoe.  Anticipate that she will return to clinic within the next month and a regular shoe.  If she feels as though she can return to work prior to her next appointment, I have encouraged her to contact the clinic to schedule an earlier follow-up.  Otherwise, I will see her in about 4 weeks.  Follow-up: Return in about 4 weeks (around 08/26/2022).  Subjective:  Chief Complaint  Patient presents with   Foot Injury    LT foot 2 week follow up Feeling better    History of Present Illness: Laura Vaughan is a 31 y.o. female who returns for evaluation of left foot pain.  She sustained an injury to her left foot approximately 6 weeks ago.  Her pain continues to improve.  She has remained nonweightbearing through her forefoot.  She has remained out of work.   Review of Systems: No fevers or chills No numbness or tingling No chest pain No shortness of breath No bowel or bladder dysfunction No GI distress No headaches    Objective: Ht 5\' 8"  (1.727 m)   Wt 200 lb (90.7 kg)   LMP  (LMP Unknown)   BMI 30.41 kg/m   Physical Exam:  General: Alert and oriented. and No acute distress. Gait: Ambulates with the assistance of a crutches.  Evaluation of left foot demonstrates no bruising.  No swelling is appreciated.  Tenderness palpation over the base of the fifth metatarsal she tolerates some gentle inversion of the ankle.  Toes warm and well-perfused.  Sensation is intact over the dorsum of the foot.  IMAGING: I personally reviewed  images previously obtained from the ED  X-rays of the left foot were obtained in clinic today.  Fracture line is visible at the base of the fifth metatarsal.  There has been some interval consolidation.  No interval displacement.  No additional injuries are noted.   Impression: Healing left fifth metatarsal base fracture.   New Medications:  No orders of the defined types were placed in this encounter.     Mordecai Rasmussen, MD  07/29/2022 3:05 PM

## 2022-08-26 ENCOUNTER — Ambulatory Visit (INDEPENDENT_AMBULATORY_CARE_PROVIDER_SITE_OTHER): Payer: Medicaid Other | Admitting: Orthopedic Surgery

## 2022-08-26 ENCOUNTER — Encounter: Payer: Self-pay | Admitting: Orthopedic Surgery

## 2022-08-26 ENCOUNTER — Ambulatory Visit (INDEPENDENT_AMBULATORY_CARE_PROVIDER_SITE_OTHER): Payer: Medicaid Other

## 2022-08-26 DIAGNOSIS — S92355D Nondisplaced fracture of fifth metatarsal bone, left foot, subsequent encounter for fracture with routine healing: Secondary | ICD-10-CM

## 2022-08-26 NOTE — Patient Instructions (Addendum)
Ok to return to work without restrictions 09/01/22  F/u 6 weeks.  If you have issues or concerns, please contact the clinic sooner.

## 2022-08-27 NOTE — Progress Notes (Signed)
Return patient Visit  Assessment: Laura Vaughan is a 31 y.o. female with the following: Left 5th metatarsal fracture, minimally displaced  Plan: Laura Vaughan has a healed fifth metatarsal base fracture.  She has transition to a regular shoe.  She is ready to return to work.  We provided her with a work note.  If she requires additional paperwork, I have asked her to forward this to the office.  I am happy to provide appropriate documentation, in order for her to return to work.  She would like to return for repeat evaluation.  I will see her in approximately 6 weeks.  Follow-up: Return in about 6 weeks (around 10/07/2022).  Subjective:  Chief Complaint  Patient presents with   Foot Pain    Left foot fx f/u, wearing refular shoe, swells some.  No meds.  Re xray.    History of Present Illness: Laura Vaughan is a 31 y.o. female who returns for evaluation of left foot pain.  She sustained an injury to her left foot approximately 10 weeks ago.  She denies pain.  She is using a regular shoe.  Occasional swelling.  She does not take medications on a consistent basis.  She feels as though she is ready to return to work.   Review of Systems: No fevers or chills No numbness or tingling No chest pain No shortness of breath No bowel or bladder dysfunction No GI distress No headaches    Objective: There were no vitals taken for this visit.  Physical Exam:  General: Alert and oriented. and No acute distress. Gait: Nonantalgic gait in regular shoes.  Left foot without swelling.  No bruising.  No tenderness palpation of the base of the fifth metatarsal.  Toes are warm and well-perfused.  Sensation is intact over the dorsum of the foot.  IMAGING: I personally reviewed images previously obtained from the ED  X-rays left foot were obtained in clinic today.  No acute injuries are noted.  Fracture line at the base of fifth metatarsal is consolidating.  No obvious callus formation.  No  dislocations.   Impression: Healed left fifth metatarsal base fracture.   New Medications:  No orders of the defined types were placed in this encounter.     Oliver Barre, MD  08/27/2022 1:30 PM

## 2022-10-01 ENCOUNTER — Ambulatory Visit
Admission: RE | Admit: 2022-10-01 | Discharge: 2022-10-01 | Disposition: A | Discharge status: Home / Self Care | Payer: Medicaid Other | Source: Ambulatory Visit | Attending: Nurse Practitioner | Admitting: Nurse Practitioner

## 2022-10-01 VITALS — BP 131/86 | HR 127 | Temp 100.7°F | Resp 18

## 2022-10-01 DIAGNOSIS — R6889 Other general symptoms and signs: Secondary | ICD-10-CM | POA: Diagnosis not present

## 2022-10-01 LAB — POCT RAPID STREP A (OFFICE): Rapid Strep A Screen: NEGATIVE

## 2022-10-01 MED ORDER — FLUTICASONE PROPIONATE 50 MCG/ACT NA SUSP: 2.0000 | Freq: Every day | NASAL | 0 refills | Status: DC

## 2022-10-01 MED ORDER — PROMETHAZINE-DM 6.25-15 MG/5ML PO SYRP: 5.0000 mL | ORAL_SOLUTION | Freq: Four times a day (QID) | ORAL | 0 refills | Status: DC | PRN

## 2022-10-01 MED ORDER — OSELTAMIVIR PHOSPHATE 75 MG PO CAPS: 75.0000 mg | ORAL_CAPSULE | Freq: Two times a day (BID) | ORAL | 0 refills | Status: DC

## 2022-10-01 NOTE — ED Provider Notes (Signed)
[facility]    CSN: [id number] Arrival date & time: 10/01/22  1455      History   Chief Complaint Chief Complaint  Patient presents with   Generalized Body Aches    Headache, fever, cough, runny nose, stuffy nose, chest pain, sore throat, body aches. - Entered by patient    HPI Laura Vaughan is a 31 y.o. female.   The history is provided by the patient.   Patient presents for complaints of fever, body aches, cough, runny nose, sore throat, and headache that started over the past 24 hours.  Patient denies abdominal pain, but states that she has also vomited twice.  She has been taking DayQuil and NyQuil for her symptoms.  Her last dose of ibuprofen was around 12 PM.  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  Past Medical History:  Diagnosis Date   Asthma    Childhood asthma 10/02/2020   COVID-19 affecting pregnancy in third trimester 10/17/2020   Dx 1/8   Depression    Depression, recurrent ([place]) 10/02/2020   Hypertension     Patient Active Problem List   Diagnosis Date Noted   Hypertension 07/24/2021   IUD (intrauterine device) in place 01/07/2021    Past Surgical History:  Procedure Laterality Date   EYE SURGERY     cateracts    OB History     Gravida  7   Para  3   Term  3   Preterm      AB  4   Living  3      SAB  4   IAB      Ectopic      Multiple  0   Live Births  3        Obstetric Comments  Had Preeclampsia in 2014-- not on magnesium per her recall          Home Medications    Prior to Admission medications   Medication Sig Start Date End Date Taking? Authorizing Provider  fluticasone (FLONASE) 50 MCG/ACT nasal spray Place 2 sprays into both nostrils daily. 10/01/22  Yes Edelyn Heidel-Warren, Alda Lea, NP  oseltamivir (TAMIFLU) 75 MG capsule Take 1 capsule (75 mg total) by mouth every 12 (twelve) hours. 10/01/22  Yes Nikya Busler-Warren, Alda Lea, NP   promethazine-dextromethorphan (PROMETHAZINE-DM) 6.25-15 MG/5ML syrup Take 5 mLs by mouth 4 (four) times daily as needed for cough. 10/01/22  Yes Katiana Ruland-Warren, Alda Lea, NP  escitalopram (LEXAPRO) 10 MG tablet Take 1 tablet (10 mg total) by mouth daily. 02/21/21   Anyanwu, Sallyanne Havers, MD    Family History Family History  Problem Relation Age of Onset   Depression Mother    50 / [place] Mother    Depression Father    Hypertension Father    Stroke Father    Aneurysm Father    Drug abuse Brother    Asthma Brother    Heart disease Maternal Grandmother    Hypertension Maternal Grandmother    Early death Maternal Grandfather    Drug abuse Maternal Grandfather     Social History Social History   Tobacco Use   Smoking status: Never   Smokeless tobacco: Never  Vaping Use   Vaping Use: Never used  Substance Use Topics   Alcohol use: Not Currently   Drug use: Never     Allergies   Patient has no known allergies.   Review of Systems Review of Systems Per  HPI  Physical Exam Triage Vital Signs ED Triage Vitals  Enc Vitals Group     BP 10/01/22 1518 131/86     Pulse Rate 10/01/22 1518 (!) 127     Resp 10/01/22 1518 18     Temp 10/01/22 1518 (!) 100.7 F (38.2 C)     Temp Source 10/01/22 1518 Oral     SpO2 10/01/22 1518 96 %     Weight --      Height --      Head Circumference --      Peak Flow --      Pain Score 10/01/22 1517 7     Pain Loc --      Pain Edu? --      Excl. in GC? --    No data found.  Updated Vital Signs BP 131/86 (BP Location: Right Arm)   Pulse (!) 127   Temp (!) 100.7 F (38.2 C) (Oral)   Resp 18   SpO2 96%   Breastfeeding No   Visual Acuity Right Eye Distance:   Left Eye Distance:   Bilateral Distance:    Right Eye Near:   Left Eye Near:    Bilateral Near:     Physical Exam Vitals and nursing note reviewed.  Constitutional:      General: She is not in acute distress.    Appearance: Normal appearance.  HENT:      Head: Normocephalic.     Right Ear: Tympanic membrane, ear canal and external ear normal.     Left Ear: Tympanic membrane, ear canal and external ear normal.     Nose: Congestion present. No rhinorrhea.     Mouth/Throat:     Lips: Pink.     Mouth: Mucous membranes are moist.     Pharynx: Uvula midline. Pharyngeal swelling and posterior oropharyngeal erythema present. No oropharyngeal exudate.     Tonsils: No tonsillar exudate. 1+ on the right. 1+ on the left.  Eyes:     Extraocular Movements: Extraocular movements intact.     Conjunctiva/sclera: Conjunctivae normal.     Pupils: Pupils are equal, round, and reactive to light.  Cardiovascular:     Rate and Rhythm: Normal rate and regular rhythm.     Pulses: Normal pulses.     Heart sounds: Normal heart sounds.  Pulmonary:     Effort: Pulmonary effort is normal.     Breath sounds: Normal breath sounds. No wheezing.  Abdominal:     General: Bowel sounds are normal.     Palpations: Abdomen is soft.     Tenderness: There is no abdominal tenderness.  Musculoskeletal:     Cervical back: Normal range of motion.  Lymphadenopathy:     Cervical: No cervical adenopathy.  Skin:    General: Skin is warm and dry.  Neurological:     General: No focal deficit present.     Mental Status: She is alert and oriented to person, place, and time.  Psychiatric:        Mood and Affect: Mood normal.        Behavior: Behavior normal.      UC Treatments / Results  Labs (all labs ordered are listed, but only abnormal results are displayed) Labs Reviewed  POCT RAPID STREP A (OFFICE)    EKG   Radiology No results found.  Procedures Procedures (including critical care time)  Medications Ordered in UC Medications - No data to display  Initial Impression / Assessment and Plan / UC  Course  I have reviewed the triage vital signs and the nursing notes.  Pertinent labs & imaging results that were available during my care of the patient were  reviewed by me and considered in my medical decision making (see chart for details).  The patient is well-appearing, she is in no acute distress, she is febrile and tachycardic at this time.  Symptoms are consistent with influenza.  Will start patient on Tamiflu 75 mg as she is within the window of her symptoms.  For her cough and nausea, Promethazine DM was prescribed and for her nasal congestion fluticasone 50 mcg nasal spray was prescribed.  Supportive care recommendations were provided to the patient.  Discussed viral etiology, and when follow-up may be necessary.  Patient was given note for work.  Patient verbalizes understanding.  All questions were answered.  Patient is stable for discharge.  Final Clinical Impressions(s) / UC Diagnoses   Final diagnoses:  Flu-like symptoms     Discharge Instructions      The rapid strep test is negative.   Take medication as prescribed. Increase fluids and allow for plenty of rest. Warm salt water gargles 3-4 times daily if throat pain develops. Recommend using a humidifier at bedtime during sleep and sleeping elevated on pillows while cough symptoms persist. Please be advised that a viral illness can last anywhere from 10 to 14 days, if you develop new symptoms, or if symptoms extend beyond that timeframe, please follow-up with your primary care physician for further evaluation. Follow-up as needed.     ED Prescriptions     Medication Sig Dispense Auth. Provider   oseltamivir (TAMIFLU) 75 MG capsule Take 1 capsule (75 mg total) by mouth every 12 (twelve) hours. 10 capsule Shamirah Ivan-Warren, Alda Lea, NP   promethazine-dextromethorphan (PROMETHAZINE-DM) 6.25-15 MG/5ML syrup Take 5 mLs by mouth 4 (four) times daily as needed for cough. 118 mL Valentin Benney Copley-Warren, Alda Lea, NP   fluticasone (FLONASE) 50 MCG/ACT nasal spray Place 2 sprays into both nostrils daily. 16 g Kennett Symes-Warren, Alda Lea, NP      PDMP not reviewed this encounter.    Tish Men, NP 10/01/22 1555

## 2022-10-01 NOTE — ED Triage Notes (Signed)
Patient presents to UC for body aches, fever, cough, runny nose, sore throat, and HA since yesterday treating symptoms with dayquil cold/flu and nyquil. Last dose of ibuprofen at 1200.

## 2022-10-01 NOTE — Discharge Instructions (Addendum)
The rapid strep test is negative.   Take medication as prescribed. Increase fluids and allow for plenty of rest. Warm salt water gargles 3-4 times daily if throat pain develops. Recommend using a humidifier at bedtime during sleep and sleeping elevated on pillows while cough symptoms persist. Please be advised that a viral illness can last anywhere from 10 to 14 days, if you develop new symptoms, or if symptoms extend beyond that timeframe, please follow-up with your primary care physician for further evaluation. Follow-up as needed.

## 2022-10-07 ENCOUNTER — Ambulatory Visit (INDEPENDENT_AMBULATORY_CARE_PROVIDER_SITE_OTHER): Payer: Medicaid Other | Admitting: Orthopedic Surgery

## 2022-10-07 ENCOUNTER — Encounter: Payer: Self-pay | Admitting: Orthopedic Surgery

## 2022-10-07 ENCOUNTER — Ambulatory Visit (INDEPENDENT_AMBULATORY_CARE_PROVIDER_SITE_OTHER): Payer: Medicaid Other

## 2022-10-07 DIAGNOSIS — S92355D Nondisplaced fracture of fifth metatarsal bone, left foot, subsequent encounter for fracture with routine healing: Secondary | ICD-10-CM | POA: Diagnosis not present

## 2022-10-07 NOTE — Progress Notes (Signed)
Return patient Visit  Assessment: Laura Vaughan is a 32 y.o. female with the following: Left 5th metatarsal fracture, minimally displaced  Plan: Laura Vaughan has a healed fifth metatarsal base fracture.  She has returned to work.  She is wearing regular shoes.  She has occasional aches and pains.  Occasional swelling.  No concerns at this time.  Radiographs show interval consolidation.  Anticipate continued improvement, and some of the tenderness can linger.  She will follow-up as needed.   Follow-up: Return if symptoms worsen or fail to improve.  Subjective:  Chief Complaint  Patient presents with   Foot Injury    Left 5th MT fracture 06/15/22     History of Present Illness: Laura Vaughan is a 32 y.o. female who returns for evaluation of left foot pain.  She sustained an injury to her left foot approximately 4 months ago.  She has returned to work.  She is wearing a regular shoe.  She has occasional swelling.  She still has some tenderness.  Overall, she is not having any issues.  No medications needed.  Review of Systems: No fevers or chills No numbness or tingling No chest pain No shortness of breath No bowel or bladder dysfunction No GI distress No headaches    Objective: There were no vitals taken for this visit.  Physical Exam:  General: Alert and oriented. and No acute distress. Gait: Nonantalgic gait in regular shoes.  Evaluation left foot demonstrates no swelling.  No bruising is appreciated.  She has excellent range of motion of her ankle.  Tenderness palpation over the base of the fifth metatarsal.  She tolerates inversion and eversion of the ankle with minimal discomfort.  Toes warm and well-perfused.  Sensation intact over the dorsum of the foot.  IMAGING: I personally reviewed images previously obtained from the ED  X-rays of the left foot were obtained in clinic today.  No acute injuries are noted.  Fracture at the base of fifth metatarsal is almost  completely consolidated.  Fracture line is only visible laterally.  No bony lesions.  Impression: Healed left fifth metatarsal base fracture   New Medications:  No orders of the defined types were placed in this encounter.     Mordecai Rasmussen, MD  10/07/2022 3:06 PM

## 2022-12-04 ENCOUNTER — Encounter: Payer: Self-pay | Admitting: Radiology

## 2023-01-30 ENCOUNTER — Encounter: Payer: Self-pay | Admitting: Family Medicine

## 2023-01-30 ENCOUNTER — Ambulatory Visit (INDEPENDENT_AMBULATORY_CARE_PROVIDER_SITE_OTHER): Payer: BC Managed Care – PPO | Admitting: Family Medicine

## 2023-01-30 VITALS — BP 137/87 | HR 85 | Temp 99.1°F | Resp 18 | Ht 68.0 in | Wt 209.6 lb

## 2023-01-30 DIAGNOSIS — F411 Generalized anxiety disorder: Secondary | ICD-10-CM | POA: Diagnosis not present

## 2023-01-30 DIAGNOSIS — F339 Major depressive disorder, recurrent, unspecified: Secondary | ICD-10-CM | POA: Diagnosis not present

## 2023-01-30 DIAGNOSIS — Z7689 Persons encountering health services in other specified circumstances: Secondary | ICD-10-CM | POA: Diagnosis not present

## 2023-01-30 MED ORDER — BUSPIRONE HCL 5 MG PO TABS: 5.0000 mg | ORAL_TABLET | Freq: Two times a day (BID) | ORAL | 0 refills | Status: DC

## 2023-01-30 MED ORDER — ESCITALOPRAM OXALATE 10 MG PO TABS: 10.0000 mg | ORAL_TABLET | Freq: Every day | ORAL | 0 refills | Status: DC

## 2023-01-30 NOTE — Patient Instructions (Addendum)
A referral to psychiatry was placed today. You may also want to call these places below.  If you need immediately mental health care, please call [phone] or go to the nearest Emergency department for immediate care.    [facility]   [facility]   [facility]

## 2023-01-30 NOTE — Progress Notes (Signed)
New Patient Office Visit  Subjective    Patient ID: Laura Vaughan, female    DOB: [birth year]  Age: 32 y.o. MRN: [id number]  CC:  Chief Complaint  Patient presents with   Establish Care    Patient states that she has been going through a lot tough situations for a long time, she states that she grew up in a pretty challenging environment , she also states that she has 3 children and has to support them by herself financially due to a recent breakup. Patient also states that her brother relapsed and she is trying to help her family cope with that.    HPI Laura Vaughan presents to establish care with this practice. She is new to me. She is a single parent of 3 children and is having a tough time. Does not get support. She is present with her 30 year old son today.    Reports treatment in past with Lexapro and therapy, did not "stick with it" she was uncomfortable talking about her problems. Interested in getting back on medications with therapy. Denies thoughts of hurting self or others.   Depression and anxiety:  Symptoms: "high anxiety" eyes twitching, scared, angry, chest pain, body aches, fatigue, tingling in arms, and headaches. Describes as "random symptoms"  "I've got a lot going on"  Has had these symptoms off and on since age 71 years.   History reviewed. Medications reviewed.       Flowsheet Row Office Visit from 01/30/2023 in [facility] Total Score 21     Denies thoughts of self harm or anyone else.       01/30/2023   10:12 AM 10/01/2020    3:10 PM  GAD 7 : Generalized Anxiety Score  Nervous, Anxious, on Edge 3 0  Control/stop worrying 3 0  Worry too much - different things 3 0  Trouble relaxing 3 0  Restless 2 0  Easily annoyed or irritable 3 0  Afraid - awful might happen 3 0  Total GAD 7 Score 20 0  Anxiety Difficulty Extremely difficult Not difficult at all     Outpatient Encounter Medications as of 01/30/2023   Medication Sig   busPIRone (BUSPAR) 5 MG tablet Take 1 tablet (5 mg total) by mouth 2 (two) times daily.   escitalopram (LEXAPRO) 10 MG tablet Take 1 tablet (10 mg total) by mouth daily.   [DISCONTINUED] escitalopram (LEXAPRO) 10 MG tablet Take 1 tablet (10 mg total) by mouth daily.   [DISCONTINUED] fluticasone (FLONASE) 50 MCG/ACT nasal spray Place 2 sprays into both nostrils daily.   [DISCONTINUED] oseltamivir (TAMIFLU) 75 MG capsule Take 1 capsule (75 mg total) by mouth every 12 (twelve) hours.   [DISCONTINUED] promethazine-dextromethorphan (PROMETHAZINE-DM) 6.25-15 MG/5ML syrup Take 5 mLs by mouth 4 (four) times daily as needed for cough.   No facility-administered encounter medications on file as of 01/30/2023.    Past Medical History:  Diagnosis Date   Asthma    Childhood asthma 10/02/2020   COVID-19 affecting pregnancy in third trimester 10/17/2020   Dx 1/8   Depression    Depression, recurrent (HCC) 10/02/2020   Hypertension     Past Surgical History:  Procedure Laterality Date   EYE SURGERY     cateracts    Family History  Problem Relation Age of Onset   Depression Mother    Miscarriages / [place] Mother    Depression Father    Hypertension Father  Stroke Father    Aneurysm Father    Drug abuse Brother    Asthma Brother    Heart disease Maternal Grandmother    Hypertension Maternal Grandmother    Early death Maternal Grandfather    Drug abuse Maternal Grandfather     Social History   Socioeconomic History   Marital status: Divorced    Spouse name: Not on file   Number of children: 2   Years of education: Not on file   Highest education level: Not on file  Occupational History   Not on file  Tobacco Use   Smoking status: Never   Smokeless tobacco: Never  Vaping Use   Vaping Use: Never used  Substance and Sexual Activity   Alcohol use: Yes    Alcohol/week: 4.0 standard drinks of alcohol    Types: 4 Glasses of wine per week   Drug use: Never    Sexual activity: Yes    Birth control/protection: None  Other Topics Concern   Not on file  Social History Narrative   Not on file   Social Determinants of Health   Financial Resource Strain: Not on file  Food Insecurity: Not on file  Transportation Needs: Not on file  Physical Activity: Not on file  Stress: Not on file  Social Connections: Not on file  Intimate Partner Violence: Not on file    Review of Systems  Eyes:  Negative for double vision.  Respiratory:  Negative for shortness of breath.   Cardiovascular:  Negative for chest pain (can't take deep breath, pressure on chest).  Gastrointestinal:  Positive for nausea. Negative for abdominal pain and vomiting.  Neurological:  Positive for headaches. Negative for dizziness.  Psychiatric/Behavioral:  Positive for depression. Negative for hallucinations, substance abuse and suicidal ideas. The patient is nervous/anxious and has insomnia (wakes up during the night).         Objective    BP 137/87   Pulse 85   Temp 99.1 F (37.3 C) (Oral)   Resp 18   Ht 5\' 8"  (1.727 m)   Wt 209 lb 9.6 oz (95.1 kg)   SpO2 98%   Breastfeeding No   BMI 31.87 kg/m   Physical Exam Vitals and nursing note reviewed.  Constitutional:      General: She is not in acute distress.    Appearance: Normal appearance.  Cardiovascular:     Rate and Rhythm: Regular rhythm.     Heart sounds: Normal heart sounds.  Pulmonary:     Effort: Pulmonary effort is normal.     Breath sounds: Normal breath sounds.  Skin:    General: Skin is warm and dry.     Capillary Refill: Capillary refill takes less than 2 seconds.  Neurological:     General: No focal deficit present.     Mental Status: She is alert. Mental status is at baseline.  Psychiatric:        Mood and Affect: Mood normal.        Behavior: Behavior normal.        Thought Content: Thought content normal.        Judgment: Judgment normal.        Assessment & Plan:   Problem List  Items Addressed This Visit     Depression, recurrent (HCC)  Long history of depression and anxiety since childhood. She is a single parent of 3 children and does not get appropriate support from significant others. Will start on excitalopram 10 mg and buspirone  5 mg daily. Referral sent for psychiatry, resources also provided for the patient to try to find someone as well. Denies suicidal/homicidal thoughts. Follow-up in 3 weeks, sooner if needed. Discussed seeking immediate help at the emergency department if she starts to have intrusive thoughts. Agrees to do so.    Relevant Medications   escitalopram (LEXAPRO) 10 MG tablet   busPIRone (BUSPAR) 5 MG tablet   Other Relevant Orders   Ambulatory referral to Psychiatry   Establishing care with new doctor, encounter for - Primary   GAD (generalized anxiety disorder)   Relevant Medications   escitalopram (LEXAPRO) 10 MG tablet   busPIRone (BUSPAR) 5 MG tablet   Other Relevant Orders   Ambulatory referral to Psychiatry  Agrees with plan of care discussed.  Questions answered.   Return in about 3 weeks (around 02/20/2023) for depression and anxiety.   Novella Olive, FNP

## 2023-02-20 ENCOUNTER — Ambulatory Visit: Payer: BC Managed Care – PPO | Admitting: Family Medicine

## 2023-02-27 ENCOUNTER — Ambulatory Visit (INDEPENDENT_AMBULATORY_CARE_PROVIDER_SITE_OTHER): Payer: BC Managed Care – PPO | Admitting: Family Medicine

## 2023-02-27 DIAGNOSIS — F339 Major depressive disorder, recurrent, unspecified: Secondary | ICD-10-CM

## 2023-02-27 DIAGNOSIS — F411 Generalized anxiety disorder: Secondary | ICD-10-CM

## 2023-02-27 MED ORDER — BUSPIRONE HCL 5 MG PO TABS: 5.0000 mg | ORAL_TABLET | Freq: Two times a day (BID) | ORAL | 0 refills | Status: AC

## 2023-02-27 MED ORDER — ESCITALOPRAM OXALATE 10 MG PO TABS: 10.0000 mg | ORAL_TABLET | Freq: Every day | ORAL | 0 refills | Status: AC

## 2023-02-27 NOTE — Progress Notes (Signed)
Established Patient Office Visit  Subjective   Patient ID: Laura Vaughan, female    DOB: 1991-09-24  Age: 32 y.o. MRN: [id number]  Chief Complaint  Patient presents with   Depression    Started Lexapro 10mg  and Buspar 5 mg. She states that Lexapro is helping some, but Buspar is not helping much.  Psch app is set for 03/13/2023.    Anxiety    HPI Follow-up for depression and anxiety: started on medication on 01/30/23.  Medication compliance: taking Lexapro 10 mg and Buspar 5 mg as prescribed.  Symptoms improving: continues to have mood changes, has noticed depression has improved "not so sunk into my depression". Has been on treatment less than one month, will continue to monitor symptoms.  Denies thoughts of suicide, some days eats too much and some days does not eat much at all, sleep has not improved, sleeps 3-6 hours, difficult to fall asleep.  Tolerating medication: tolerating well, no side effects reported.  Continue current regimen: lexapro 10 mg and buspar 5 mg daily.  Psychiatry/counseling: has appointment on 6/7 at 1100.  Follow-up: 2 months, sooner if needed.   Flowsheet Row Office Visit from 02/27/2023 in [facility]  PHQ-9 Total Score 21   Denies thoughts of suicide. Feels like depression is slowly improving. Understands it takes weeks to reach therapeutic affect of medication, has appointment with psychiatry soon.       02/27/2023    8:34 AM 01/30/2023   10:12 AM 10/01/2020    3:10 PM  GAD 7 : Generalized Anxiety Score  Nervous, Anxious, on Edge 3 3 0  Control/stop worrying 3 3 0  Worry too much - different things 3 3 0  Trouble relaxing 3 3 0  Restless 3 2 0  Easily annoyed or irritable 2 3 0  Afraid - awful might happen 2 3 0  Total GAD 7 Score 19 20 0  Anxiety Difficulty Very difficult Extremely difficult Not difficult at all  Will continue to monitor. Hopeful that counseling will improve treatment plan.      Review of Systems   Psychiatric/Behavioral:  Positive for depression. Negative for suicidal ideas. The patient is nervous/anxious and has insomnia (not improving at this time).       Objective:     BP 124/87   Pulse 90   Temp 98.4 F (36.9 C) (Oral)   Ht 5\' 8"  (1.727 m)   Wt 210 lb 14.4 oz (95.7 kg)   SpO2 97%   BMI 32.07 kg/m  BP Readings from Last 3 Encounters:  02/27/23 124/87  01/30/23 137/87  10/01/22 131/86      Physical Exam Vitals and nursing note reviewed.  Constitutional:      General: She is not in acute distress.    Appearance: Normal appearance.  Cardiovascular:     Rate and Rhythm: Regular rhythm.     Heart sounds: Normal heart sounds.  Pulmonary:     Effort: Pulmonary effort is normal.     Breath sounds: Normal breath sounds.  Skin:    General: Skin is warm and dry.     Capillary Refill: Capillary refill takes less than 2 seconds.  Neurological:     General: No focal deficit present.     Mental Status: Mental status is at baseline.  Psychiatric:        Mood and Affect: Mood normal.        Behavior: Behavior normal.  Thought Content: Thought content normal.        Judgment: Judgment normal.     No results found for any visits on 02/27/23.    The ASCVD Risk score (Arnett DK, et al., 2019) failed to calculate for the following reasons:   The 2019 ASCVD risk score is only valid for ages 35 to 27    Assessment & Plan:   Problem List Items Addressed This Visit     Depression, recurrent (HCC)  Symptoms have improved slightly. Denies thoughts of self-harm. Has appointment with psychiatry on 6/7. Will continue current regimen and continue to monitor. Understands it can take weeks to reach full therapeutic affect of treatment. Follow-up in 2 months, refills sent.    Relevant Medications   busPIRone (BUSPAR) 5 MG tablet   escitalopram (LEXAPRO) 10 MG tablet   GAD (generalized anxiety disorder)  Will continue to monitor while awaiting full therapeutic affect  of medication. Has appointment with psychiatry in June. Follow-up in 2 months, sooner if needed. Denies thoughts of self harm. Refills sent.    Relevant Medications   busPIRone (BUSPAR) 5 MG tablet   escitalopram (LEXAPRO) 10 MG tablet  Agrees with plan of care discussed.  Questions answered.   Return in about 2 months (around 04/29/2023) for depression and anxiety .    Novella Olive, FNP

## 2023-03-13 ENCOUNTER — Telehealth (HOSPITAL_COMMUNITY): Payer: BC Managed Care – PPO | Admitting: Psychiatry

## 2023-03-13 ENCOUNTER — Encounter (HOSPITAL_COMMUNITY): Payer: Self-pay

## 2023-04-03 ENCOUNTER — Ambulatory Visit: Payer: Medicaid Other | Admitting: Family Medicine

## 2023-04-22 ENCOUNTER — Ambulatory Visit: Payer: Medicaid Other | Admitting: Family Medicine

## 2023-05-01 ENCOUNTER — Ambulatory Visit: Payer: BC Managed Care – PPO | Admitting: Family Medicine
